# Patient Record
Sex: Male | Born: 1965
Health system: Southern US, Community
[De-identification: ages and names within clinical notes are randomized; demographics above are authoritative.]

## PROBLEM LIST (undated history)

## (undated) DIAGNOSIS — E7800 Pure hypercholesterolemia, unspecified: Secondary | ICD-10-CM

## (undated) DIAGNOSIS — I10 Essential (primary) hypertension: Secondary | ICD-10-CM

## (undated) HISTORY — PX: EYE SURGERY: SHX253

## (undated) HISTORY — PX: APPENDECTOMY: SHX54

---

## 2017-03-03 ENCOUNTER — Encounter (HOSPITAL_BASED_OUTPATIENT_CLINIC_OR_DEPARTMENT_OTHER): Payer: Self-pay | Admitting: Emergency Medicine

## 2017-03-03 ENCOUNTER — Emergency Department (HOSPITAL_BASED_OUTPATIENT_CLINIC_OR_DEPARTMENT_OTHER): Payer: Managed Care, Other (non HMO)

## 2017-03-03 ENCOUNTER — Emergency Department (HOSPITAL_BASED_OUTPATIENT_CLINIC_OR_DEPARTMENT_OTHER)
Admission: EM | Admit: 2017-03-03 | Discharge: 2017-03-03 | Disposition: A | Discharge status: Home / Self Care | Payer: Managed Care, Other (non HMO) | Attending: Emergency Medicine | Admitting: Emergency Medicine

## 2017-03-03 DIAGNOSIS — I10 Essential (primary) hypertension: Secondary | ICD-10-CM | POA: Insufficient documentation

## 2017-03-03 DIAGNOSIS — Y939 Activity, unspecified: Secondary | ICD-10-CM | POA: Insufficient documentation

## 2017-03-03 DIAGNOSIS — S39012A Strain of muscle, fascia and tendon of lower back, initial encounter: Secondary | ICD-10-CM | POA: Insufficient documentation

## 2017-03-03 DIAGNOSIS — Y999 Unspecified external cause status: Secondary | ICD-10-CM | POA: Insufficient documentation

## 2017-03-03 DIAGNOSIS — Y929 Unspecified place or not applicable: Secondary | ICD-10-CM | POA: Diagnosis not present

## 2017-03-03 DIAGNOSIS — R1031 Right lower quadrant pain: Secondary | ICD-10-CM | POA: Insufficient documentation

## 2017-03-03 DIAGNOSIS — M545 Low back pain, unspecified: Secondary | ICD-10-CM

## 2017-03-03 DIAGNOSIS — T148XXA Other injury of unspecified body region, initial encounter: Secondary | ICD-10-CM

## 2017-03-03 DIAGNOSIS — Z79899 Other long term (current) drug therapy: Secondary | ICD-10-CM | POA: Insufficient documentation

## 2017-03-03 DIAGNOSIS — X58XXXA Exposure to other specified factors, initial encounter: Secondary | ICD-10-CM | POA: Diagnosis not present

## 2017-03-03 HISTORY — DX: Essential (primary) hypertension: I10

## 2017-03-03 HISTORY — DX: Pure hypercholesterolemia, unspecified: E78.00

## 2017-03-03 LAB — COMPREHENSIVE METABOLIC PANEL
ALBUMIN: 4 g/dL (ref 3.5–5.0)
ALK PHOS: 42 U/L (ref 38–126)
ALT: 27 U/L (ref 17–63)
ANION GAP: 6 (ref 5–15)
AST: 20 U/L (ref 15–41)
BUN: 18 mg/dL (ref 6–20)
CO2: 26 mmol/L (ref 22–32)
CREATININE: 1.21 mg/dL (ref 0.61–1.24)
Calcium: 9.1 mg/dL (ref 8.9–10.3)
Chloride: 105 mmol/L (ref 101–111)
GFR calc Af Amer: >60 mL/min (ref >60)
GFR calc non Af Amer: >60 mL/min (ref >60)
Glucose, Bld: 103 mg/dL — ABNORMAL HIGH (ref 65–99)
Potassium: 3.9 mmol/L (ref 3.5–5.1)
SODIUM: 137 mmol/L (ref 135–145)
TOTAL PROTEIN: 7.9 g/dL (ref 6.5–8.1)
Total Bilirubin: 0.8 mg/dL (ref 0.3–1.2)

## 2017-03-03 LAB — URINALYSIS, ROUTINE W REFLEX MICROSCOPIC
Bilirubin Urine: NEGATIVE
Glucose, UA: NEGATIVE mg/dL
Hgb urine dipstick: NEGATIVE
Ketones, ur: NEGATIVE mg/dL
LEUKOCYTES UA: NEGATIVE
NITRITE: NEGATIVE
PROTEIN: NEGATIVE mg/dL
SPECIFIC GRAVITY, URINE: 1.025 (ref 1.005–1.030)
pH: 6 (ref 5.0–8.0)

## 2017-03-03 LAB — CBC WITH DIFFERENTIAL/PLATELET
BASOS ABS: 0 10*3/uL (ref 0.0–0.1)
Basophils Relative: 0 %
EOS PCT: 2 %
Eosinophils Absolute: 0.1 10*3/uL (ref 0.0–0.7)
HCT: 43.5 % (ref 39.0–52.0)
HEMOGLOBIN: 15 g/dL (ref 13.0–17.0)
LYMPHS ABS: 1.4 10*3/uL (ref 0.7–4.0)
Lymphocytes Relative: 39 %
MCH: 30.1 pg (ref 26.0–34.0)
MCHC: 34.5 g/dL (ref 30.0–36.0)
MCV: 87.2 fL (ref 78.0–100.0)
Monocytes Absolute: 0.4 10*3/uL (ref 0.1–1.0)
Monocytes Relative: 11 %
NEUTROS PCT: 48 %
Neutro Abs: 1.7 10*3/uL (ref 1.7–7.7)
PLATELETS: 261 10*3/uL (ref 150–400)
RBC: 4.99 MIL/uL (ref 4.22–5.81)
RDW: 13.6 % (ref 11.5–15.5)
WBC: 3.6 10*3/uL — AB (ref 4.0–10.5)

## 2017-03-03 MED ORDER — KETOROLAC TROMETHAMINE 30 MG/ML IJ SOLN
30.0000 mg | Freq: Once | INTRAMUSCULAR | Status: AC
  Administered 2017-03-03: 30 mg via INTRAVENOUS
  Filled 2017-03-03: qty 1

## 2017-03-03 MED ORDER — LISINOPRIL 10 MG PO TABS
10.0000 mg | ORAL_TABLET | Freq: Once | ORAL | Status: AC
  Administered 2017-03-03: 10 mg via ORAL
  Filled 2017-03-03: qty 1

## 2017-03-03 MED ORDER — CYCLOBENZAPRINE HCL 10 MG PO TABS: 10.0000 mg | ORAL_TABLET | Freq: Two times a day (BID) | ORAL | 0 refills | Status: DC | PRN

## 2017-03-03 MED ORDER — MORPHINE SULFATE (PF) 4 MG/ML IV SOLN
4.0000 mg | Freq: Once | INTRAVENOUS | Status: AC
  Administered 2017-03-03: 4 mg via INTRAVENOUS
  Filled 2017-03-03: qty 1

## 2017-03-03 MED ORDER — IOPAMIDOL (ISOVUE-300) INJECTION 61%
100.0000 mL | Freq: Once | INTRAVENOUS | Status: AC | PRN
  Administered 2017-03-03: 100 mL via INTRAVENOUS

## 2017-03-03 MED FILL — CYCLOBENZAPRINE 10 MG TAB: 10 | 10 days supply | Qty: 20 | Fill #0

## 2017-03-03 NOTE — ED Triage Notes (Signed)
Pt reports intense R side back pain after bending over yesterday. Pt states he took a nap and when he woke up pain is radiating to R hip and feels like it may be in his abd as well. Denies urinary symptoms.

## 2017-03-03 NOTE — ED Provider Notes (Signed)
[facility]   CSN: [id number] Arrival date & time: 03/03/17  4098     History   Chief Complaint Chief Complaint  Patient presents with  . Back Pain    HPI Jordan Woods is a 51 y.o. male with PMH/o HTN who presents with 2 days of right sided back pain. Patient reports that pain began while patient was bending down. He reports that since onset, his pain has radiated to the right lower abdomen and right hip. He describes his pain as a "pressure sensation," and reports that it is worse when he ambulates or moves and is better with laying supine. When he ambulates, he describes it as "all the pressure pressing down on my right side."  He reports that he took OTC Gas-X and Advil with no improvement in symptoms. He denies any preceding trauma, fall, injury. He reports his last BM was 2 days ago and was normal. No presence of blood.  Denies fevers, weight loss, numbness/weakness of upper and lower extremities, bowel/bladder incontinence, saddle anesthesia, history of back surgery, history of IVDA. He denies any CP, SOB, nausea/vomiting, dysuria, hematuria, testicular pain/swelling.   The history is provided by the patient.    Past Medical History:  Diagnosis Date  . High cholesterol   . Hypertension     There are no active problems to display for this patient.   Past Surgical History:  Procedure Laterality Date  . APPENDECTOMY    . EYE SURGERY         Home Medications    Prior to Admission medications   Medication Sig Start Date End Date Taking? Authorizing Provider  lisinopril (PRINIVIL,ZESTRIL) 10 MG tablet Take 10 mg by mouth daily.   Yes [provider]  cyclobenzaprine (FLEXERIL) 10 MG tablet Take 1 tablet (10 mg total) by mouth 2 (two) times daily as needed for muscle spasms. 03/03/17   Maxwell Caul, PA-C    Family History No family history on file.  Social History Social History  Substance Use Topics  . Smoking status: Never  Smoker  . Smokeless tobacco: Never Used  . Alcohol use Yes     Allergies   Shellfish allergy   Review of Systems Review of Systems  Constitutional: Negative for chills and fever.  Respiratory: Negative for shortness of breath.   Cardiovascular: Negative for chest pain.  Gastrointestinal: Positive for abdominal pain. Negative for diarrhea, nausea and vomiting.  Genitourinary: Positive for flank pain. Negative for dysuria and hematuria.  Musculoskeletal: Positive for back pain. Negative for neck pain.  Neurological: Negative for dizziness, weakness, numbness and headaches.     Physical Exam Updated Vital Signs BP (!) 169/100 (BP Location: Right Arm)   Pulse 67   Temp 98.7 F (37.1 C) (Oral)   Resp 16   SpO2 100%   Physical Exam  Constitutional: He is oriented to person, place, and time. He appears well-developed and well-nourished.  Appears uncomfortable but no acute distress   HENT:  Head: Normocephalic and atraumatic.  Mouth/Throat: Oropharynx is clear and moist and mucous membranes are normal.  Eyes: Pupils are equal, round, and reactive to light. Conjunctivae, EOM and lids are normal.  Left eye prothesis  Neck: Full passive range of motion without pain.  Full flexion/extension and lateral movement of neck fully intact. No bony midline tenderness. No deformities or crepitus.   Cardiovascular: Normal rate, regular rhythm, normal heart sounds and normal pulses.  Exam reveals no gallop and no friction rub.  No murmur heard. Pulses:      Radial pulses are 2+ on the right side, and 2+ on the left side.       Dorsalis pedis pulses are 2+ on the right side, and 2+ on the left side.  Pulmonary/Chest: Effort normal and breath sounds normal.  Abdominal: Soft. Normal appearance and bowel sounds are normal. He exhibits no distension. There is tenderness in the right lower quadrant and suprapubic area. There is no rigidity, no guarding and no CVA tenderness.  Abdomen is soft,  non-distended. Diffuse tenderness to the right lower abdomen  Musculoskeletal: Normal range of motion.       Thoracic back: He exhibits no tenderness.       Lumbar back: He exhibits no tenderness.  No T or L spine midline tenderness. Diffuse muscular tenderness overlying the right flank that radiates to the right abdomen and in the right hip area. No tenderness to palpation to the right hip joint with no overlying warmth, erythema or ecchymosis. No deformity or crepitus. Pain exacerbated with flexion and internal rotation of hip. BLE are symmetric in appearance.   Neurological: He is alert and oriented to person, place, and time.  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 Capillary refill takes less than 2 seconds.  Psychiatric: He has a normal mood and affect. His speech is normal.  Nursing note and vitals reviewed.    ED Treatments / Results  Labs (all labs ordered are listed, but only abnormal results are displayed) Labs Reviewed  COMPREHENSIVE METABOLIC PANEL - Abnormal; Notable for the following:       Result Value   Glucose, Bld 103 (*)    All other components within normal limits  CBC WITH DIFFERENTIAL/PLATELET - Abnormal; Notable for the following:    WBC 3.6 (*)    All other components within normal limits  URINALYSIS, ROUTINE W REFLEX MICROSCOPIC    EKG  EKG Interpretation None       Radiology Ct Abdomen Pelvis W Contrast  Result Date: 03/03/2017 CLINICAL DATA:  Intense right-sided back pain after bending over yesterday. Pain radiating to the right hip abdomen. EXAM: CT ABDOMEN AND PELVIS WITH CONTRAST TECHNIQUE: Multidetector CT imaging of the abdomen and pelvis was performed using the standard protocol following bolus administration of intravenous contrast.  CONTRAST:  ISOVUE-300 IOPAMIDOL (ISOVUE-300) INJECTION 61% COMPARISON:  None. FINDINGS: Lower chest: 5 mm average diameter subpleural nodule in the right lower lobe. No follow-up recommended per consensus criteria in this patient listed as never smoker. Hepatobiliary: There is a hypervascular rounded mass in the subcapsular left liver measuring 22 mm. Round features are more typical of mass than a perfusion anomaly. This area is not covered on delayed phase which is centered over the kidneys. There is no cirrhotic changes are history of malignancy. No evidence of biliary obstruction or stone. Pancreas: Unremarkable. Spleen: Unremarkable. Adrenals/Urinary Tract: Negative adrenals. No hydronephrosis or stone. Unremarkable bladder. Stomach/Bowel: No obstruction. No inflammatory changes. History of appendectomy. Vascular/Lymphatic: No acute vascular abnormality. No mass or adenopathy. Reproductive:No pathologic findings. Other: Trace pelvic fluid that is nonspecific.  No pneumoperitoneum. Musculoskeletal: No acute abnormalities. Mild spondylosis and facet arthropathy. No visible impingement. IMPRESSION: 1. No specific explanation for right-sided back pain. No acute intra-abdominal finding or evidence of injury. 2.  2.2 cm hypervascular focus in the left liver with rounded shape favoring mass upper perfusion anomaly. Flash fill hemangioma can have this appearance, but features are not specific on this single phase study. At this size, liver MRI is recommended per consensus guidelines (assuming no outside comparison). Electronically Signed   By: Marnee Spring M.D.   On: 03/03/2017 10:47    Procedures Procedures (including critical care time)  Medications Ordered in ED Medications  morphine 4 MG/ML injection 4 mg (4 mg Intravenous Given 03/03/17 0948)  lisinopril (PRINIVIL,ZESTRIL) tablet 10 mg (10 mg Oral Given 03/03/17 0947)  iopamidol (ISOVUE-300) 61 % injection 100 mL (100 mLs Intravenous Contrast Given  03/03/17 1023)  ketorolac (TORADOL) 30 MG/ML injection 30 mg (30 mg Intravenous Given 03/03/17 1139)     Initial Impression / Assessment and Plan / ED Course  I have reviewed the triage vital signs and the nursing notes.  Pertinent labs & imaging results that were available during my care of the patient were reviewed by me and considered in my medical decision making (see chart for details).     51 yo M who presents with 2 days of right back pain That radiated to the right abdomen. Patient is afebrile, non-toxic appearing, sitting comfortably on examination table. Patient is slightly hypertensive, likely secondary to pain. Will reassess. No red flag symptoms or neuro deficits noted on exam. Patient has equal pulses in all 4 extremities. Physical exam shows tenderness to the right flank that radiates to the right abdomen. Patient has diffuse right lower quadrant tenderness. Patient has had an appendectomy previously so no concern for appendicitis. Consider musculoskeletal pain given that patient's pain is worsened with movement and ambulation and improved with rest. Also consider acute infectious etiology given tenderness to the right lower quadrant, the low suspicion given lack of fever, nausea/vomiting. Do not suspect aortic dissection at this time. History/physical exam are not concerning for cauda equina or spinal abscess. Also consider kidney stone, the low suspicion given history/physical exam. We'll plan to check basic labs including CMP, CBC, UA. Will obtain CT abdomen pelvis for further evaluation. Analgesics provided in the department.  Labs reviewed. CMP within normal limits. CBC shows white blood cell count of 3.6 otherwise unremarkable. Urinalysis is negative for any acute signs of infection or any signs of hemoglobin. CT pending. Discussed results with patient. He reports mild improvement of pain after pain medication.   CT abdomen reviewed. There is no evidence of any acute infectious  abnormality. There is mention of a 2.2 cm hypervascular focus in the left liver that may be concerning for hemangioma. Recommends further MRI evaluation on an outpatient basis. Discussed results with patient and wife. Instructed him that he will need an MRI for further evaluation but that this can be done on an outpatient basis. He reports that pain has slightly returned. He reports that he went to ambulate to go the bathroom and had worsening pain. Will plan to give an additional pain medication repeat vitals.  Valuation. Patient reports improvement in pain. Repeat abdominal exam is benign. He still has some mild tenderness but is greatly improved. He still slightly hypertensive, likely secondary to pain. Will plan to treat as musculoskeletal pain. Conservative therapies discussed with patient. Provided patient with a list of clinic resources to use if he does not have a PCP. Instructed to call them today to arrange follow-up in the next 24-48 hours. Strict return precautions discussed. Patient expresses understanding and agreement to plan.  Final Clinical Impressions(s) / ED Diagnoses   Final diagnoses:  Acute right-sided low back pain without sciatica  Muscle strain    New Prescriptions Discharge Medication List as of 03/03/2017 12:31 PM    START taking these medications   Details  cyclobenzaprine (FLEXERIL) 10 MG tablet Take 1 tablet (10 mg total) by mouth 2 (two) times daily as needed for muscle spasms., Starting Thu 03/03/2017, Print         Maxwell Caul, PA-C 03/03/17 1709    Tilden Fossa, MD 03/06/17 [phone]

## 2017-03-03 NOTE — Discharge Instructions (Signed)
Follow-up with your primary care doctor in the next 24-48 hour for further evaluation.  If you do not have a primary care doctor, you can use one provided in the list below.  You can take Tylenol or Ibuprofen as directed for pain. You can alternate Tylenol and Ibuprofen every 4 hours. If you take Tylenol at 1pm, then you can take Ibuprofen at 5pm. Then you can take Tylenol again at 9pm.   Do not take any more ibuprofen until later tonight as the medication we gave you has ibuprofen in it.   Take Flexeril as prescribed. This medication will make you drowsy so do not drive or drink alcohol when taking it.    Return to the Emergency Department immediately for any worsening back pain, neck pain, difficulty walking, numbness/weaknss of your arms or legs, urinary or bowel accidents, fever or any other worsening or concerning symptoms.    Back Pain:   Your back pain should be treated with medicines such as ibuprofen or aleve and this back pain should get better over the next 2 weeks.  However if you develop severe or worsening pain, low back pain with fever, numbness, weakness or inability to walk or urinate, you should return to the ER immediately.    Please follow up with your doctor this week for a recheck if still having symptoms . Low back pain is discomfort in the lower back that may be due to injuries to muscles and ligaments around the spine.  Occasionally, it may be caused by a a problem to a part of the spine called a disc.  The pain may last several days or a week;  However, most patients get completely well in 4 weeks.  Self - care:  The application of heat can help soothe the pain.  Maintaining your daily activities, including walking, is encourged, as it will help you get better faster than just staying in bed.  Medications are also useful to help with pain control.  A commonly prescribed medications includes acetaminophen.  This medication is generally safe, though you should not take  more than 8 of the extra strength ( ) pills a day.  Non steroidal anti inflammatory medications including Ibuprofen and naproxen;  These medications help both pain and swelling and are very useful in treating back pain.  They should be taken with food, as they can cause stomach upset, and more seriously, stomach bleeding.    Muscle relaxants:  These medications can help with muscle tightness that is a cause of lower back pain.  Most of these medications can cause drowsiness, and it is not safe to drive or use dangerous machinery while taking them.

## 2017-03-03 NOTE — ED Notes (Signed)
ED Provider at bedside. 

## 2019-08-18 ENCOUNTER — Ambulatory Visit: Payer: 59 | Attending: Internal Medicine

## 2019-08-18 DIAGNOSIS — Z23 Encounter for immunization: Secondary | ICD-10-CM

## 2019-08-18 NOTE — Progress Notes (Signed)
   Covid-19 Vaccination Clinic  Name:  Jordan Woods    MRN: [id number] DOB: Jan 23, 1966  08/18/2019  [name] was observed post Covid-19 immunization for 15 minutes without incident. He was provided with Vaccine Information Sheet and instruction to access the V-Safe system.   [name] was instructed to call 911 with any severe reactions post vaccine: Marland Kitchen Difficulty breathing  . Swelling of face and throat  . A fast heartbeat  . A bad rash all over body  . Dizziness and weakness   Immunizations Administered    Name Date Dose VIS Date Route   Pfizer COVID-19 Vaccine 08/18/2019  4:29 PM 0.3 mL 05/25/2019 Intramuscular   Manufacturer: [employer], [employer]   Lot: [id number]   NDC: [address]-1

## 2019-09-18 ENCOUNTER — Ambulatory Visit: Payer: 59 | Attending: Internal Medicine

## 2019-09-18 DIAGNOSIS — Z23 Encounter for immunization: Secondary | ICD-10-CM

## 2019-09-18 NOTE — Progress Notes (Signed)
   Covid-19 Vaccination Clinic  Name:  Jordan Woods    MRN: [id number] DOB: [birth year]  09/18/2019  Jordan Woods was observed post Covid-19 immunization for 15 minutes without incident. He was provided with Vaccine Information Sheet and instruction to access the V-Safe system.   Jordan Woods was instructed to call 911 with any severe reactions post vaccine: Marland Kitchen Difficulty breathing  . Swelling of face and throat  . A fast heartbeat  . A bad rash all over body  . Dizziness and weakness   Immunizations Administered    Name Date Dose VIS Date Route   Pfizer COVID-19 Vaccine 09/18/2019  3:19 PM 0.3 mL 05/25/2019 Intramuscular   Manufacturer: [employer], [employer]   Lot: [id number]   NDC: [address]-0

## 2019-11-28 ENCOUNTER — Observation Stay (HOSPITAL_BASED_OUTPATIENT_CLINIC_OR_DEPARTMENT_OTHER)
Admission: EM | Admit: 2019-11-28 | Discharge: 2019-11-29 | Disposition: A | Discharge status: Home / Self Care | Payer: No Typology Code available for payment source | Attending: Family Medicine | Admitting: Family Medicine

## 2019-11-28 ENCOUNTER — Other Ambulatory Visit: Payer: Self-pay

## 2019-11-28 ENCOUNTER — Emergency Department (HOSPITAL_BASED_OUTPATIENT_CLINIC_OR_DEPARTMENT_OTHER): Payer: No Typology Code available for payment source

## 2019-11-28 ENCOUNTER — Emergency Department (HOSPITAL_COMMUNITY): Payer: No Typology Code available for payment source

## 2019-11-28 ENCOUNTER — Encounter (HOSPITAL_BASED_OUTPATIENT_CLINIC_OR_DEPARTMENT_OTHER): Payer: Self-pay | Admitting: Emergency Medicine

## 2019-11-28 DIAGNOSIS — Z79899 Other long term (current) drug therapy: Secondary | ICD-10-CM | POA: Insufficient documentation

## 2019-11-28 DIAGNOSIS — R4701 Aphasia: Secondary | ICD-10-CM | POA: Diagnosis present

## 2019-11-28 DIAGNOSIS — I1310 Hypertensive heart and chronic kidney disease without heart failure, with stage 1 through stage 4 chronic kidney disease, or unspecified chronic kidney disease: Secondary | ICD-10-CM | POA: Insufficient documentation

## 2019-11-28 DIAGNOSIS — Z8249 Family history of ischemic heart disease and other diseases of the circulatory system: Secondary | ICD-10-CM | POA: Insufficient documentation

## 2019-11-28 DIAGNOSIS — I6782 Cerebral ischemia: Secondary | ICD-10-CM | POA: Diagnosis not present

## 2019-11-28 DIAGNOSIS — D72819 Decreased white blood cell count, unspecified: Secondary | ICD-10-CM | POA: Insufficient documentation

## 2019-11-28 DIAGNOSIS — Z91013 Allergy to seafood: Secondary | ICD-10-CM | POA: Insufficient documentation

## 2019-11-28 DIAGNOSIS — N1831 Chronic kidney disease, stage 3a: Secondary | ICD-10-CM | POA: Diagnosis not present

## 2019-11-28 DIAGNOSIS — K219 Gastro-esophageal reflux disease without esophagitis: Secondary | ICD-10-CM | POA: Insufficient documentation

## 2019-11-28 DIAGNOSIS — R29898 Other symptoms and signs involving the musculoskeletal system: Secondary | ICD-10-CM

## 2019-11-28 DIAGNOSIS — Z20822 Contact with and (suspected) exposure to covid-19: Secondary | ICD-10-CM | POA: Diagnosis not present

## 2019-11-28 DIAGNOSIS — Z9001 Acquired absence of eye: Secondary | ICD-10-CM | POA: Insufficient documentation

## 2019-11-28 DIAGNOSIS — R202 Paresthesia of skin: Secondary | ICD-10-CM | POA: Diagnosis present

## 2019-11-28 DIAGNOSIS — E7800 Pure hypercholesterolemia, unspecified: Secondary | ICD-10-CM | POA: Insufficient documentation

## 2019-11-28 DIAGNOSIS — I639 Cerebral infarction, unspecified: Secondary | ICD-10-CM | POA: Diagnosis not present

## 2019-11-28 DIAGNOSIS — E785 Hyperlipidemia, unspecified: Secondary | ICD-10-CM | POA: Insufficient documentation

## 2019-11-28 DIAGNOSIS — I63312 Cerebral infarction due to thrombosis of left middle cerebral artery: Secondary | ICD-10-CM

## 2019-11-28 DIAGNOSIS — I10 Essential (primary) hypertension: Secondary | ICD-10-CM | POA: Diagnosis not present

## 2019-11-28 LAB — CBC
HCT: 42.6 % (ref 39.0–52.0)
Hemoglobin: 14.2 g/dL (ref 13.0–17.0)
MCH: 30.9 pg (ref 26.0–34.0)
MCHC: 33.3 g/dL (ref 30.0–36.0)
MCV: 92.8 fL (ref 80.0–100.0)
Platelets: 290 10*3/uL (ref 150–400)
RBC: 4.59 MIL/uL (ref 4.22–5.81)
RDW: 13.5 % (ref 11.5–15.5)
WBC: 3.9 10*3/uL — ABNORMAL LOW (ref 4.0–10.5)
nRBC: 0 % (ref 0.0–0.2)

## 2019-11-28 LAB — COMPREHENSIVE METABOLIC PANEL
ALT: 24 U/L (ref 0–44)
AST: 21 U/L (ref 15–41)
Albumin: 4.1 g/dL (ref 3.5–5.0)
Alkaline Phosphatase: 44 U/L (ref 38–126)
Anion gap: 9 (ref 5–15)
BUN: 18 mg/dL (ref 6–20)
CO2: 28 mmol/L (ref 22–32)
Calcium: 9.1 mg/dL (ref 8.9–10.3)
Chloride: 101 mmol/L (ref 98–111)
Creatinine, Ser: 1.47 mg/dL — ABNORMAL HIGH (ref 0.61–1.24)
GFR calc Af Amer: >60 mL/min (ref >60)
GFR calc non Af Amer: 54 mL/min — ABNORMAL LOW (ref >60)
Glucose, Bld: 92 mg/dL (ref 70–99)
Potassium: 3.9 mmol/L (ref 3.5–5.1)
Sodium: 138 mmol/L (ref 135–145)
Total Bilirubin: 0.5 mg/dL (ref 0.3–1.2)
Total Protein: 7.9 g/dL (ref 6.5–8.1)

## 2019-11-28 LAB — DIFFERENTIAL
Abs Immature Granulocytes: 0.01 10*3/uL (ref 0.00–0.07)
Basophils Absolute: 0 10*3/uL (ref 0.0–0.1)
Basophils Relative: 1 %
Eosinophils Absolute: 0.1 10*3/uL (ref 0.0–0.5)
Eosinophils Relative: 3 %
Immature Granulocytes: 0 %
Lymphocytes Relative: 36 %
Lymphs Abs: 1.4 10*3/uL (ref 0.7–4.0)
Monocytes Absolute: 0.4 10*3/uL (ref 0.1–1.0)
Monocytes Relative: 11 %
Neutro Abs: 1.9 10*3/uL (ref 1.7–7.7)
Neutrophils Relative %: 49 %

## 2019-11-28 LAB — PROTIME-INR
INR: 1 (ref 0.8–1.2)
Prothrombin Time: 12.4 seconds (ref 11.4–15.2)

## 2019-11-28 LAB — SARS CORONAVIRUS 2 BY RT PCR (HOSPITAL ORDER, PERFORMED IN ~~LOC~~ HOSPITAL LAB): SARS Coronavirus 2: NEGATIVE

## 2019-11-28 LAB — APTT: aPTT: 28 seconds (ref 24–36)

## 2019-11-28 MED ORDER — ATORVASTATIN CALCIUM 10 MG PO TABS
20.0000 mg | ORAL_TABLET | Freq: Every day | ORAL | Status: DC
  Administered 2019-11-28: 20 mg via ORAL
  Filled 2019-11-28: qty 2

## 2019-11-28 MED ORDER — HYDRALAZINE HCL 20 MG/ML IJ SOLN: 5.0000 mg | INTRAMUSCULAR | Status: DC | PRN

## 2019-11-28 MED ORDER — SODIUM CHLORIDE 0.9% FLUSH
3.0000 mL | Freq: Once | INTRAVENOUS | Status: DC
  Filled 2019-11-28: qty 3

## 2019-11-28 MED ORDER — PANTOPRAZOLE SODIUM 40 MG PO TBEC
40.0000 mg | DELAYED_RELEASE_TABLET | Freq: Every day | ORAL | Status: DC
  Administered 2019-11-28 – 2019-11-29 (×2): 40 mg via ORAL
  Filled 2019-11-28 (×2): qty 1

## 2019-11-28 MED ORDER — IOHEXOL 350 MG/ML SOLN
100.0000 mL | Freq: Once | INTRAVENOUS | Status: AC | PRN
  Administered 2019-11-28: 80 mL via INTRAVENOUS

## 2019-11-28 MED ORDER — ATORVASTATIN CALCIUM 40 MG PO TABS
40.0000 mg | ORAL_TABLET | Freq: Every day | ORAL | Status: DC
  Administered 2019-11-28: 20 mg via ORAL
  Administered 2019-11-29: 40 mg via ORAL
  Filled 2019-11-28 (×2): qty 1

## 2019-11-28 MED ORDER — AMLODIPINE BESYLATE 5 MG PO TABS
5.0000 mg | ORAL_TABLET | Freq: Every day | ORAL | Status: DC
  Administered 2019-11-28: 5 mg via ORAL
  Filled 2019-11-28: qty 1

## 2019-11-28 MED ORDER — ASPIRIN 81 MG PO CHEW
81.0000 mg | CHEWABLE_TABLET | Freq: Every day | ORAL | Status: DC
  Administered 2019-11-29: 81 mg via ORAL
  Filled 2019-11-28: qty 1

## 2019-11-28 MED ORDER — ENOXAPARIN SODIUM 40 MG/0.4ML ~~LOC~~ SOLN
40.0000 mg | SUBCUTANEOUS | Status: DC
  Administered 2019-11-28: 40 mg via SUBCUTANEOUS
  Filled 2019-11-28: qty 0.4

## 2019-11-28 MED ORDER — HYDROCHLOROTHIAZIDE 12.5 MG PO CAPS: 12.5000 mg | ORAL_CAPSULE | Freq: Every day | ORAL | Status: DC

## 2019-11-28 MED ORDER — LISINOPRIL 20 MG PO TABS: 20.0000 mg | ORAL_TABLET | Freq: Every day | ORAL | Status: DC

## 2019-11-28 NOTE — ED Notes (Signed)
Dinner ordered 

## 2019-11-28 NOTE — Progress Notes (Addendum)
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  Given this, paged neuro to discuss recommendations regarding antiplatelet therapy and DVT PPx.  Spoke with Dr. Otelia Limes.  He states that since patient stroke is chronic, he can be on DVT prophylaxis and aspirin.  Patient does not need to be on Plavix.  Believe that stroke is small vessel caused by chronic hypertension.  States that patient should not have permissive hypertension at this time, goal should be 120/80.  He should be on aspirin 81 mg.  He should also start a light daily walking program and focus of his treatment should be blood pressure control.  Update: Per PCP note 09/14/2019, patient on norvasc 5mg  and lisinopril-HCTZ 20mg -12.5mg  2 tabs daily.  Will start norvasc now at 5mg .  Given that patient had contrast today with CTA, will hold off on starting Lisinopril and HCTZ.  Will see patient's response to norvasc and add hydral if patient is not at goal overnight.   Jordan Woods, , DO 11/28/2019, 8:02 PM PGY-2, [facility] pager [phone]

## 2019-11-28 NOTE — ED Provider Notes (Signed)
4:10 PM-patient has arrived from [facility] for further evaluation/MRI imaging.  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  He describes having difficulty storing his coffee, 3 days ago.  He has preserved strength, "I was able to lift weights this morning," but a sensation of "numbness" in his right forearm and hand.  He is eating well.  He denies chest pain, shortness of breath, headache, blurred vision, focal weakness or dizziness.  Plan-MRI imaging is pending-4:15 PM  5:50 PM-MRI consistent with acute left brain stroke.  This is clinically synergistic with exam.  Will arrange for admission.  At this time the patient is calm comfortable.  Findings discussed with him and all questions were answered.  His PCP is with [facility].  5:56 PM-Consult complete with on-call family medicine resident. Patient case explained and discussed.  He agrees to admit patient for further evaluation and treatment. Call ended at 6:08 PM  6:01 PM-case discussed with neuro hospitalist, who will see the patient as a Research scientist (medical) in the hospital.   Mancel Bale, MD 11/28/19 1814

## 2019-11-28 NOTE — ED Triage Notes (Signed)
PT here with aphasia, limited spatial awareness and right arm numbness since Saturday. 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

## 2019-11-28 NOTE — ED Notes (Signed)
Attempted report x 1, 5W RN to call for report when available.

## 2019-11-28 NOTE — Consult Note (Signed)
Referring Physician: Dr. Manson Passey    Chief Complaint: RUE weakness, incoordination and numbness  HPI: Jordan Woods is a 54 y.o. male with a history of hypercholesterolemia and HTN who presented to the ED this AM with RUE weakness, incoordination and numbness since Saturday AM, first noticed on awakening from sleep. Examples of the deficits he noticed include difficulty stirring his coffee due to incoordination, difficulty getting some words out correctly when speaking, slurred speech, dropping cups of coffee and trouble writing.   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 Also noted was a chronic right thalamic hemorrhage.  LSN: Satrurday tPA Given: No: Out of the time window  Past Medical History:  Diagnosis Date   High cholesterol    Hypertension     Past Surgical History:  Procedure Laterality Date   APPENDECTOMY     EYE SURGERY      History reviewed. No pertinent family history. Social History:  reports that he has never smoked. He has never used smokeless tobacco. He reports current alcohol use. He reports that he does not use drugs.  Allergies:  Allergies  Allergen Reactions   Shellfish Allergy     Home Medications:  No current facility-administered medications on file prior to encounter.   Current Outpatient Medications on File Prior to Encounter  Medication Sig Dispense Refill   amLODipine (NORVASC) 5 MG tablet Take by mouth.     atorvastatin (LIPITOR) 20 MG tablet Take 1 tablet by mouth daily.     pantoprazole (PROTONIX) 40 MG tablet Take by mouth.     cyclobenzaprine (FLEXERIL) 10 MG tablet Take 1 tablet (10 mg total) by mouth 2 (two) times daily as needed for muscle spasms. 20 tablet 0   lisinopril (PRINIVIL,ZESTRIL) 10 MG tablet Take 10 mg by mouth  daily.       ROS: No vision changes. No lower extremity weakness or numbness. Other symptoms as per HPI with comprehensive ROS otherwise negative.   Physical Examination: Blood pressure (!) 148/104, pulse 77, temperature 97.8 F (36.6 C), resp. rate 15, height 6' (1.829 m), weight 112.5 kg, SpO2 99 %.  HEENT: [place]/AT. Left eye enucleation (chronic) Lungs: Respirations unlabored Ext: No edema  Neurologic Examination: Mental Status: Alert, oriented, thought content appropriate.  Speech fluent without evidence of aphasia.  Able to follow all commands without difficulty. No dysarthria noted.  Cranial Nerves: II:  Visual fields intact OD. Enucleated left eye. Fixates and tracks normally with right eye.  III,IV, VI: Left eyelid shut when prosthetic globe has been removed. Right eye without ptosis. Right eye EOMI. No nystagmus.  V,VII: Mild RLQ facial weakness. Temp sensation equal bilaterally.  VIII: Hearing intact to conversation IX,X: No hypophonia or hoarseness XI: Slight lag on the right with shoulder shrug XII: Tongue protrudes slightly to the right of midline  Motor: RUE with 4+/5 grip, biceps, triceps and deltoid LUE 5/5 BLE 5/5 except for 4+/5 right plantar flexion Mild pronator drift on the right.  Sensory: Temp and light touch intact in all 4 extremities. No extinction to DSS Deep Tendon Reflexes:  Normoactive x 4.  Plantars: Right: downgoing   Left: downgoing Cerebellar: Slow FNF on the right without dysmetria or dyssinergia. Normal FNF on the left.  Gait: Deferred  Results for orders placed or  performed during the hospital encounter of 11/28/19 (from the past 48 hour(s))  Protime-INR     Status: None   Collection Time: 11/28/19 11:21 AM  Result Value Ref Range   Prothrombin Time 12.4 11.4 - 15.2 seconds   INR 1.0 0.8 - 1.2    Comment: (NOTE) INR goal varies based on device and disease states. Performed at [facility], [address] [place], [place]  [address]   APTT     Status: None   Collection Time: 11/28/19 11:21 AM  Result Value Ref Range   aPTT 28 24 - 36 seconds    Comment: Performed at [facility], [address] [place], [place] [address]  CBC     Status: Abnormal   Collection Time: 11/28/19 11:21 AM  Result Value Ref Range   WBC 3.9 (L) 4.0 - 10.5 K/uL   RBC 4.59 4.22 - 5.81 MIL/uL   Hemoglobin 14.2 13.0 - 17.0 g/dL   HCT 93.2 39 - 52 %   MCV 92.8 80.0 - 100.0 fL   MCH 30.9 26.0 - 34.0 pg   MCHC 33.3 30.0 - 36.0 g/dL   RDW 67.1 24.5 - 80.9 %   Platelets 290 150 - 400 K/uL   nRBC 0.0 0.0 - 0.2 %    Comment: Performed at [facility], [address] [place], [place] [address]  Differential     Status: None   Collection Time: 11/28/19 11:21 AM  Result Value Ref Range   Neutrophils Relative % 49 %   Neutro Abs 1.9 1.7 - 7.7 K/uL   Lymphocytes Relative 36 %   Lymphs Abs 1.4 0.7 - 4.0 K/uL   Monocytes Relative 11 %   Monocytes Absolute 0.4 0 - 1 K/uL   Eosinophils Relative 3 %   Eosinophils Absolute 0.1 0 - 0 K/uL   Basophils Relative 1 %   Basophils Absolute 0.0 0 - 0 K/uL   Immature Granulocytes 0 %   Abs Immature Granulocytes 0.01 0.00 - 0.07 K/uL    Comment: Performed at [facility], [address] [place], [place] [address]  Comprehensive metabolic panel     Status: Abnormal   Collection Time: 11/28/19 11:21 AM  Result Value Ref Range   Sodium 138 135 - 145 mmol/L   Potassium 3.9 3.5 - 5.1 mmol/L   Chloride 101 98 - 111 mmol/L   CO2 28 22 - 32 mmol/L   Glucose, Bld 92 70 - 99 mg/dL    Comment: Glucose reference range applies only to samples taken after fasting for at least 8 hours.   BUN 18 6 - 20 mg/dL   Creatinine, Ser 9.76 (H) 0.61 - 1.24 mg/dL   Calcium 9.1 8.9 - 73.4 mg/dL   Total Protein 7.9 6.5 - 8.1 g/dL   Albumin 4.1 3.5 - 5.0 g/dL   AST 21 15 - 41 U/L   ALT 24 0 - 44 U/L   Alkaline Phosphatase 44 38 - 126 U/L   Total Bilirubin 0.5 0.3 - 1.2 mg/dL   GFR  calc non Af Amer 54 (L) >60 mL/min   GFR calc Af Amer >60 >60 mL/min   Anion gap 9 5 - 15    Comment: Performed at [facility], [address] [place], [place] [address]  SARS Coronavirus 2 by RT PCR (hospital order, performed in [facility] hospital lab) Nasopharyngeal Nasopharyngeal Swab  Status: None   Collection Time: 11/28/19 12:07 PM   Specimen: Nasopharyngeal Swab  Result Value Ref Range   SARS Coronavirus 2 NEGATIVE NEGATIVE    Comment: (NOTE) SARS-CoV-2 target nucleic acids are NOT DETECTED.  The SARS-CoV-2 RNA is generally detectable in upper and lower respiratory specimens during the acute phase of infection. The lowest concentration of SARS-CoV-2 viral copies this assay can detect is 250 copies / mL. A negative result does not preclude SARS-CoV-2 infection and should not be used as the sole basis for treatment or other patient management decisions.  A negative result may occur with improper specimen collection / handling, submission of specimen other than nasopharyngeal swab, presence of viral mutation(s) within the areas targeted by this assay, and inadequate number of viral copies (<250 copies / mL). A negative result must be combined with clinical observations, patient history, and epidemiological information.  Fact Sheet for Patients:   StrictlyIdeas.no  Fact Sheet for Healthcare Providers: BankingDealers.co.za  This test is not yet approved or  cleared by the [place] FDA and has been authorized for detection and/or diagnosis of SARS-CoV-2 by FDA under an Emergency Use Authorization (EUA).  This EUA will remain in effect (meaning this test can be used) for the duration of the COVID-19 declaration under Section 564(b)(1) of the Act, 21 U.S.C. section 360bbb-3(b)(1), unless the authorization is terminated or revoked sooner.  Performed at [facility], [address], [place],  [place] [address]    CT Angio Head W/Cm &/Or Wo Cm  Result Date: 11/28/2019 CLINICAL DATA:  Aphasia. Right arm numbness. Limited spatial awareness. EXAM: CT ANGIOGRAPHY HEAD TECHNIQUE: Multidetector CT imaging of the head was performed using the standard protocol during bolus administration of intravenous contrast. Multiplanar CT image reconstructions and MIPs were obtained to evaluate the vascular anatomy. CONTRAST:  5mL OMNIPAQUE IOHEXOL 350 MG/ML SOLN COMPARISON:  None. FINDINGS: CT HEAD Brain: Ventricle size normal. Cerebral volume slightly decreased for age. Moderate to extensive white matter disease bilaterally. This is symmetric and appears most consistent with chronic microvascular ischemia. Negative for acute infarct, hemorrhage, mass. Vascular: Negative for hyperdense vessel Skull: Negative Sinuses: Mild mucosal edema left maxillary sinus. Remaining sinuses clear. 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 Right orbit normal. CTA HEAD Anterior circulation: Cavernous carotid widely patent without atherosclerotic disease or stenosis. No aneurysm. Anterior and middle cerebral arteries widely patent bilaterally without stenosis. Anterior and middle cerebral arteries widely patent bilaterally without stenosis or large vessel occlusion Posterior circulation: Left vertebral artery dominant and widely patent. Left PICA patent. Small right vertebral artery is patent. Right PICA patent. Basilar widely patent. AICA, superior cerebellar, posterior cerebral arteries patent bilaterally without stenosis or aneurysm. Venous sinuses: Normal venous enhancement. Anatomic variants: None IMPRESSION: 1. Extensive small vessel ischemic change in the white matter which is most likely chronic. Given the patient's symptoms, recommend MRI to evaluate for acute infarct. Correlate with risk factors for small vessel disease. No intracranial hemorrhage. 2. Negative CTA head 3. These results were called by  telephone at the time of interpretation on 11/28/2019 at 1:10 pm to provider [facility] , who verbally acknowledged these results. Electronically Signed   By: Franchot Gallo M.D.   On: 11/28/2019 13:11   MR Brain Wo Contrast (neuro protocol)  Result Date: 11/28/2019 CLINICAL DATA:  Right upper extremity weakness and incoordination. Speech disturbance. EXAM: MRI HEAD WITHOUT CONTRAST TECHNIQUE: Multiplanar, multiecho pulse sequences of the brain and surrounding structures were obtained without  intravenous contrast. 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 Patchy and confluent T2 hyperintensities in the cerebral white matter bilaterally are nonspecific but compatible with moderate to severe chronic small vessel ischemic disease, greatly advanced for age. There is a chronic hemorrhage superiorly in the right thalamus. There is mild cerebral atrophy. Vascular: Abnormal appearance of the distal right vertebral artery, shown to be hypoplastic but patent on the earlier CTA. Skull and upper cervical spine: Unremarkable bone marrow signal. Sinuses/Orbits: Left ocular prosthesis. Mild scattered mucosal thickening and small mucous retention cysts in the paranasal sinuses. Clear mastoid air cells. Other: None. IMPRESSION: 1. 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 2. Moderate to severe chronic small vessel ischemic disease. 3. Chronic right thalamic hemorrhage. Electronically Signed   By: Sebastian Ache M.D.   On: 11/28/2019 17:45    Assessment: 54 y.o. male presenting with RUE weakness and incoordination. Symptoms began on Saturday.  1. Exam reveals mild right sided motor findings.  2. 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 Also noted was a chronic right thalamic hemorrhage. 3.  Stroke Risk Factors - HTN and hypercholesterolemia  Recommendations: 1. HgbA1c, fasting lipid panel 2. MRA of the brain without contrast 3. PT consult, OT consult, Speech consult 4. TTE 5. Carotid dopplers 6. Prophylactic therapy- Start ASA 81 mg po qd. Benefits of antiplatelet therapy for secondary stroke prevention outweigh risks of hemorrhage in the context of the known chronic right thalamic hemorrhage, provided that his BP is consistently well-controlled 7. Risk factor modification to include a daily light exercise regimen such as walking.  8. Telemetry monitoring 9. Frequent neuro checks 10. BP management with goal of 120/80. Out of permissive HTN time window. 11. Increase atorvastatin to 40 mg po qd. Obtain baseline CK level    @Electronically  signed: Dr.  11/28/2019, 7:15 PM

## 2019-11-28 NOTE — H&P (Signed)
[facility] Admission History and Physical Service Pager: [phone]  Patient name: Jordan Woods Medical record number: [id number] Date of birth: September 21, 1965 Age: 54 y.o. Gender: male  Primary Care Provider: Patient, No Pcp Per Consultants: Neurology Code Status: Full  Chief Complaint: Arm weakness and speech change  Assessment and Plan: Jordan Woods is a 54 y.o. male presenting with acute stroke. PMH is significant for hypertension, hyperlipidemia, leukopenia  Stroke Patient is a 54 year old male that presents today after 4 days of arm weakness and numbness and slurring of speech.  CTA head was significant for extensive small vessel ischemic change in the white matter, most likely chronic.  MRI performed in the emergency department significant for acute infarct of the posterior limb of the left internal capsule, moderate to severe chronic small vessel ischemic disease and a right chronic thalamic hemorrhage.  Neurology was consulted in the emergency department due to the acute stroke.  He has a known history of hypertension and a family history significant for stroke in his father, MI in his brother.  He denies any history of palpitations or known arrhythmias. -Admit to progressive, attending Dr. Owens Shark -Neurology consult, appreciate recommendations -Holding aspirin and Plavix for now while awaiting neurology recommendations -SLP, he passed bedside swallow but SLP would still be helpful for further evaluation of his dysarthria -Follow-up echocardiogram -Follow-up neurology recommendations regarding neck imaging -Continuous pulse ox -Continuous cardiac monitoring -PT/OT evaluate and treat -Frequent neurochecks -Vitals per floor -Follow-up lipid panel and A1c  Hypertension Blood pressures in the emergency department ranged from 138-148/87-104.  Home medications include lisinopril-hydrochlorothiazide 20-12.5 daily as well as amlodipine 5 mg/day. -Hold home  medications for now -Permissive hypertension -Hydralazine for systolic pressures over 967, diastolic pressures over 893  Hyperlipidemia Home medications include atorvastatin 20 mg -We will plan to increase to at least atorvastatin 40 mg -Follow-up lipid panel  Leukopenia-stable Per care everywhere notes it looks like the plan at 1 time was to refer him for hematology.  Per chart review it appears patient had a consult with hematology on 11/11/2017 and per review it looks like his white count around that time was 3200 with an absolute neutrophil count of 1500.  Per chart review of years the plan was to recheck the CBC in 1 year as this was a likely benign finding.  CBC during this admission significant for white blood cell count of 3.9 with absolute neutrophil count of 1,900. -No further work-up at this time  Question of OSA Per recent office visit notes it appears the patient has had an increase in fatigue in 2019 with patient's wife stating that he snores a lot.  At that time the patient was focusing on trying to lose at least 10% of his body weight.  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  He is currently wearing an eye patch while he does not have a prosthetic eye in place.  FEN/GI: General diet Prophylaxis: Lovenox  Disposition: 1-2 days of hospitalization anticipated prior to discharge  History of Present Illness:  Jordan Woods is a 54 y.o. male presenting with new CVA.  He has a previous medical history significant for hypertension.  Jordan Woods notes that he first felt his symptoms Saturday morning (4 days ago) upon waking up.  At that time, he noticed some right arm numbness below the  elbow and difficulty using his right hand.  In the past 4 days, he is noticed significant difficulty writing with his right hand-his dominant  hand-and the has to hold the pencil with both hands in order to form legible letters.  He is also noticed some difficulty talking.  He has to focus incredibly hard to form his words and avoid slurring his speech.  His wife is also noticed his occasional garbled speech in the past 3-4 days.  After 3 days of persistent symptoms, he tried to schedule an appointment with his PCP with [facility] in [facility] but she was unavailable.  He then went to urgent care where he was directed immediately to the [facility] ED.  In the ED, a CTA head was performed which showed no evidence of intracranial hemorrhage and no obvious lesion.  This is followed up with an MR brain due to high suspicion for stroke which revealed a stroke to the left internal capsule.  Neuro was consulted in the ED and is planning to consult on the patient although has not yet seen her left a note for [name].  Review Of Systems: Per HPI with the following additions:   Review of Systems  Constitutional: Negative for appetite change, chills, fatigue and fever.  HENT: Negative for sore throat and trouble swallowing.   Eyes: Negative for photophobia and visual disturbance.  Respiratory: Negative for cough, chest tightness and shortness of breath.   Cardiovascular: Negative for chest pain and palpitations.  Gastrointestinal: Negative for abdominal pain, diarrhea, nausea and vomiting.  Genitourinary: Negative for difficulty urinating.  Musculoskeletal: Positive for gait problem.  Neurological: Positive for speech difficulty, weakness and numbness (right forearm/hand). Negative for dizziness, facial asymmetry and headaches.     Patient Active Problem List   Diagnosis Date Noted  . Stroke [facility]) 11/28/2019    Past Medical History: Past Medical History:  Diagnosis Date  . High cholesterol   . Hypertension     Past Surgical History: Past Surgical History:  Procedure Laterality Date  . APPENDECTOMY    . EYE SURGERY       Social History: Social History   Tobacco Use  . Smoking status: Never Smoker  . Smokeless tobacco: Never Used  Substance Use Topics  . Alcohol use: Yes  . Drug use: No   Additional social history: Lives at home with his wife and frequently travels to [place] to work on their [place]. Please also refer to relevant sections of EMR.  Family History: History reviewed. No pertinent family history.  Allergies and Medications: Allergies  Allergen Reactions  . Shellfish Allergy    No current facility-administered medications on file prior to encounter.   Current Outpatient Medications on File Prior to Encounter  Medication Sig Dispense Refill  . amLODipine (NORVASC) 5 MG tablet Take by mouth.    Marland Kitchen atorvastatin (LIPITOR) 20 MG tablet Take 1 tablet by mouth daily.    . pantoprazole (PROTONIX) 40 MG tablet Take by mouth.    . cyclobenzaprine (FLEXERIL) 10 MG tablet Take 1 tablet (10 mg total) by mouth 2 (two) times daily as needed for muscle spasms. 20 tablet 0  . lisinopril (PRINIVIL,ZESTRIL) 10 MG tablet Take 10 mg by mouth daily.      Objective: BP (!) 148/104   Pulse 77   Temp 97.8 F (36.6 C)   Resp 15   Ht 6' (1.829 m)   Wt 112.5 kg   SpO2 99%   BMI 33.63 kg/m  Exam: Physical Exam Constitutional:      General: He is not in acute distress.    Appearance: Normal appearance. He is not ill-appearing.     Comments: Patch worn over his left eye.  HENT:     Right Ear: External ear normal.     Left Ear: External ear normal.     Mouth/Throat:     Mouth: Mucous membranes are moist.     Pharynx: Oropharynx is clear.  Eyes:     Extraocular Movements: Extraocular movements intact.     Conjunctiva/sclera: Conjunctivae normal.     Pupils: Pupils are equal, round, and reactive to light.  Cardiovascular:     Rate and Rhythm: Normal rate and regular rhythm.     Pulses: Normal pulses.     Heart sounds: Normal heart sounds. No murmur heard.  No gallop.    Pulmonary:     Effort: Pulmonary effort is normal.     Breath sounds: Normal breath sounds.  Abdominal:     General: Abdomen is flat.     Palpations: Abdomen is soft.  Musculoskeletal:        General: No swelling or tenderness.     Cervical back: Normal range of motion and neck supple.  Skin:    General: Skin is warm and dry.  Neurological:     Mental Status: He is alert.     Comments: Cranial nerves II through XII intact.  Mild garbled speech appreciated during our conversation no gross deficits on cranial nerve exam. Upper extremities: 5/5 strength for elbow flexion and extension although Mr. Janey Greaser reports he may have some decreased strength in his right arm.  5/5 grip strength bilaterally.  Sensation to light touch diminished on the right side.  2+ biceps and brachioradialis reflex. Lower extremities: 5/5 strength for hip flexion, plantarflexion, dorsiflexion bilaterally.  Psychiatric:        Mood and Affect: Mood normal.        Thought Content: Thought content normal.       Labs and Imaging: CBC BMET  Recent Labs  Lab 11/28/19 1121  WBC 3.9*  HGB 14.2  HCT 42.6  PLT 290   Recent Labs  Lab 11/28/19 1121  NA 138  K 3.9  CL 101  CO2 28  BUN 18  CREATININE 1.47*  GLUCOSE 92  CALCIUM 9.1     CT Angio Head W/Cm &/Or Wo Cm  Result Date: 11/28/2019 CLINICAL DATA:  Aphasia. Right arm numbness. Limited spatial awareness. EXAM: CT ANGIOGRAPHY HEAD TECHNIQUE: Multidetector CT imaging of the head was performed using the standard protocol during bolus administration of intravenous contrast. Multiplanar CT image reconstructions and MIPs were obtained to evaluate the vascular anatomy. CONTRAST:  26mL OMNIPAQUE IOHEXOL 350 MG/ML SOLN COMPARISON:  None. FINDINGS: CT HEAD Brain: Ventricle size normal. Cerebral volume slightly decreased for age. Moderate to extensive white matter disease bilaterally. This is symmetric and appears most consistent with chronic microvascular  ischemia. Negative for acute infarct, hemorrhage, mass. Vascular: Negative for hyperdense vessel Skull: Negative Sinuses: Mild mucosal edema left maxillary sinus. Remaining sinuses clear. 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 Right orbit normal. CTA HEAD Anterior circulation: Cavernous carotid widely patent without atherosclerotic disease or stenosis. No aneurysm. Anterior and middle cerebral arteries widely patent bilaterally without stenosis. Anterior and middle cerebral arteries widely patent bilaterally without stenosis or large vessel occlusion Posterior circulation: Left vertebral artery dominant and widely patent. Left PICA patent. Small right vertebral artery is patent.  Right PICA patent. Basilar widely patent. AICA, superior cerebellar, posterior cerebral arteries patent bilaterally without stenosis or aneurysm. Venous sinuses: Normal venous enhancement. Anatomic variants: None IMPRESSION: 1. Extensive small vessel ischemic change in the white matter which is most likely chronic. Given the patient's symptoms, recommend MRI to evaluate for acute infarct. Correlate with risk factors for small vessel disease. No intracranial hemorrhage. 2. Negative CTA head 3. These results were called by telephone at the time of interpretation on 11/28/2019 at 1:10 pm to provider [facility] , who verbally acknowledged these results. Electronically Signed   By: Marlan Palau M.D.   On: 11/28/2019 13:11   MR Brain Wo Contrast (neuro protocol)  Result Date: 11/28/2019 CLINICAL DATA:  Right upper extremity weakness and incoordination. Speech disturbance. EXAM: MRI HEAD WITHOUT CONTRAST TECHNIQUE: Multiplanar, multiecho pulse sequences of the brain and surrounding structures were obtained without intravenous contrast. 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 Patchy and confluent T2  hyperintensities in the cerebral white matter bilaterally are nonspecific but compatible with moderate to severe chronic small vessel ischemic disease, greatly advanced for age. There is a chronic hemorrhage superiorly in the right thalamus. There is mild cerebral atrophy. Vascular: Abnormal appearance of the distal right vertebral artery, shown to be hypoplastic but patent on the earlier CTA. Skull and upper cervical spine: Unremarkable bone marrow signal. Sinuses/Orbits: Left ocular prosthesis. Mild scattered mucosal thickening and small mucous retention cysts in the paranasal sinuses. Clear mastoid air cells. Other: None. IMPRESSION: 1. 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 2. Moderate to severe chronic small vessel ischemic disease. 3. Chronic right thalamic hemorrhage. Electronically Signed   By: Sebastian Ache M.D.   On: 11/28/2019 17:45     Mirian Mo, MD 11/28/2019, 6:56 PM  PGY-2, [facility] FPTS Intern pager: [phone], text pages welcome

## 2019-11-28 NOTE — ED Provider Notes (Signed)
[facility] EMERGENCY DEPARTMENT Provider Note   CSN: [id number] Arrival date & time: 11/28/19  1050     History Chief Complaint  Patient presents with  . Aphasia  . right arm numbess    Jordan Woods is a 54 y.o. male.  HPI Patient was in [place] this weekend helping his wife do renovations on a home they own.  All day Friday he did extensive amounts of renovation work without difficulty.  He went to bed Friday night about 1 AM.  He reports he woke up around 10 in the morning and went in to make coffee.  He was trying to stir his coffee and noticed that his right hand was very incoordinated and had difficulty making the normal circular motion with a coffee.  He also noticed that he had to be exceptionally careful in speaking so that the words would come out correctly.  He reports his speech was a little slurred.  He reports he was hoping it would get better and did not seek any treatment at that time.  He did not have any lower extremity symptoms.  He reports his strength in the arm seemed to be intact he was still able to work out.  He reports however the coordination was bad.  He has not been able to write his name.  He reports today he dropped 2 cups of coffee and determined as it is not going away he needed to come and get evaluated.  No headache or visual changes.  Patient reports he does wear an eye patch on the left sometimes due to old traumatic eye injury.  Patient has a prosthesis he wears typically.  Patient already has a history of hypertension which has been controlled on medications.    Past Medical History:  Diagnosis Date  . High cholesterol   . Hypertension     There are no problems to display for this patient.   Past Surgical History:  Procedure Laterality Date  . APPENDECTOMY    . EYE SURGERY         History reviewed. No pertinent family history.  Social History   Tobacco Use  . Smoking status: Never Smoker  . Smokeless tobacco: Never Used    Substance Use Topics  . Alcohol use: Yes  . Drug use: No    Home Medications Prior to Admission medications   Medication Sig Start Date End Date Taking? Authorizing Provider  amLODipine (NORVASC) 5 MG tablet Take by mouth. 09/14/19  Yes [provider]  atorvastatin (LIPITOR) 20 MG tablet Take 1 tablet by mouth daily. 09/14/19  Yes [provider]  pantoprazole (PROTONIX) 40 MG tablet Take by mouth. 09/14/19  Yes [provider]  cyclobenzaprine (FLEXERIL) 10 MG tablet Take 1 tablet (10 mg total) by mouth 2 (two) times daily as needed for muscle spasms. 03/03/17   Maxwell Caul, PA-C  lisinopril (PRINIVIL,ZESTRIL) 10 MG tablet Take 10 mg by mouth daily.    [provider]    Allergies    Shellfish allergy  Review of Systems   Review of Systems 10 systems reviewed and negative except as per HPI Physical Exam Updated Vital Signs BP (!) 147/104 (BP Location: Right Arm)   Pulse 67   Temp 97.8 F (36.6 C)   Resp 16   Ht 6' (1.829 m)   Wt 112.5 kg   SpO2 100%   BMI 33.63 kg/m   Physical Exam Constitutional:      Comments: Alert nontoxic.  Well-nourished well-developed.  Mental status clear.  HENT:     Head: Normocephalic and atraumatic.     Mouth/Throat:     Mouth: Mucous membranes are moist.     Pharynx: Oropharynx is clear.  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  Right eye symmetric pupil responsive to light otherwise normal eye  Cardiovascular:     Rate and Rhythm: Normal rate and regular rhythm.  Pulmonary:     Effort: Pulmonary effort is normal.     Breath sounds: Normal breath sounds.  Abdominal:     General: There is no distension.     Palpations: Abdomen is soft.     Tenderness: There is no abdominal tenderness. There is no guarding.  Musculoskeletal:        General: No swelling or tenderness. Normal range of motion.     Cervical back: Neck supple.     Right lower leg: No edema.     Left lower leg: No edema.   Skin:    General: Skin is warm and dry.  Neurological:     Comments: Mental status is clear.  Cognitive function is normal.  Speech is clear.  Cranial nerves intact except does appear to have slight right deviation of tongue.  Phonation is clear.  Very subtle strength differential right to left upper extremities.  Patient has excellent general strength.  Right extension against resistance seems slightly less than left.  Lower extremities 5\5 lower extremity strength testing.  Patient does not endorse differential to light touch x4 extremities  Psychiatric:        Mood and Affect: Mood normal.     ED Results / Procedures / Treatments   Labs (all labs ordered are listed, but only abnormal results are displayed) Labs Reviewed  CBC - Abnormal; Notable for the following components:      Result Value   WBC 3.9 (*)    All other components within normal limits  COMPREHENSIVE METABOLIC PANEL - Abnormal; Notable for the following components:   Creatinine, Ser 1.47 (*)    GFR calc non Af Amer 54 (*)    All other components within normal limits  SARS CORONAVIRUS 2 BY RT PCR (HOSPITAL ORDER, [facility])  PROTIME-INR  APTT  DIFFERENTIAL    EKG EKG Interpretation  Date/Time:  Wednesday November 28 2019 10:58:07 EDT Ventricular Rate:  77 PR Interval:    QRS Duration: 92 QT Interval:  382 QTC Calculation: 433 R Axis:   104 Text Interpretation: Sinus rhythm Right axis deviation Anteroseptal infarct, old Borderline repolarization abnormality Borderline ST elevation, lateral leads agree, no STEMI. no old comparison Confirmed by Charlesetta Shanks [phone]) on 11/28/2019 2:29:13 PM   Radiology CT Angio Head W/Cm &/Or Wo Cm  Result Date: 11/28/2019 CLINICAL DATA:  Aphasia. Right arm numbness. Limited spatial awareness. EXAM: CT ANGIOGRAPHY HEAD TECHNIQUE: Multidetector CT imaging of the head was performed using the standard protocol during bolus administration of intravenous  contrast. Multiplanar CT image reconstructions and MIPs were obtained to evaluate the vascular anatomy. CONTRAST:  33mL OMNIPAQUE IOHEXOL 350 MG/ML SOLN COMPARISON:  None. FINDINGS: CT HEAD Brain: Ventricle size normal. Cerebral volume slightly decreased for age. Moderate to extensive white matter disease bilaterally. This is symmetric and appears most consistent with chronic microvascular ischemia. Negative for acute infarct, hemorrhage, mass. Vascular: Negative for hyperdense vessel Skull: Negative Sinuses: Mild mucosal edema left maxillary sinus. Remaining sinuses clear. Orbits: Left ocular prosthesis which is position laterally and  posteriorly. Right orbit normal. CTA HEAD Anterior circulation: Cavernous carotid widely patent without atherosclerotic disease or stenosis. No aneurysm. Anterior and middle cerebral arteries widely patent bilaterally without stenosis. Anterior and middle cerebral arteries widely patent bilaterally without stenosis or large vessel occlusion Posterior circulation: Left vertebral artery dominant and widely patent. Left PICA patent. Small right vertebral artery is patent. Right PICA patent. Basilar widely patent. AICA, superior cerebellar, posterior cerebral arteries patent bilaterally without stenosis or aneurysm. Venous sinuses: Normal venous enhancement. Anatomic variants: None IMPRESSION: 1. Extensive small vessel ischemic change in the white matter which is most likely chronic. Given the patient's symptoms, recommend MRI to evaluate for acute infarct. Correlate with risk factors for small vessel disease. No intracranial hemorrhage. 2. Negative CTA head 3. These results were called by telephone at the time of interpretation on 11/28/2019 at 1:10 pm to provider [facility] , who verbally acknowledged these results. Electronically Signed   By: Marlan Palau M.D.   On: 11/28/2019 13:11    Procedures Procedures (including critical care time)  Medications Ordered in  ED Medications  sodium chloride flush (NS) 0.9 % injection 3 mL (3 mLs Intravenous Not Given 11/28/19 1111)  iohexol (OMNIPAQUE) 350 MG/ML injection 100 mL (80 mLs Intravenous Contrast Given 11/28/19 1241)    ED Course  I have reviewed the triage vital signs and the nursing notes.  Pertinent labs & imaging results that were available during my care of the patient were reviewed by me and considered in my medical decision making (see chart for details).  Clinical Course as of Nov 28 1427  Wed Nov 28, 2019  1207 Consult: Reviewed with Dr. Wilford Corner.  CT angio head and MRI.  Neurology consult when MRI completed.   [MP]    Clinical Course User Index [MP] Arby Barrette, MD  Consult: Dr. Gustavus Messing at [facility] emergency department accepts for transfer for MRI and further evaluation MDM Rules/Calculators/A&P                         Patient presents as outlined with strokelike symptoms that occurred greater than 72 hours ago.  He has residual incoordination with the right hand.  CT angio shows extensive small vessel ischemic disease.  Patient's case has been reviewed with Dr. Jerrell Belfast.  Patient will require transfer for MRI and complete stroke evaluation.  Patient's blood pressures are stable.  Mental status is clear.  Neurologic exam is grossly normal.  Patient is very physically well-developed and strong.  There does appear to be subtle weakness on the right.  Will do ED to ED transfer to proceed with MRI and neurology consultation.  Final Clinical Impression(s) / ED Diagnoses Final diagnoses:  Right arm weakness  Ischemic cerebrovascular disease    Rx / DC Orders ED Discharge Orders    None       Arby Barrette, MD 11/28/19 1432

## 2019-11-29 ENCOUNTER — Observation Stay (HOSPITAL_BASED_OUTPATIENT_CLINIC_OR_DEPARTMENT_OTHER): Payer: No Typology Code available for payment source

## 2019-11-29 DIAGNOSIS — I6782 Cerebral ischemia: Secondary | ICD-10-CM | POA: Diagnosis not present

## 2019-11-29 DIAGNOSIS — E7800 Pure hypercholesterolemia, unspecified: Secondary | ICD-10-CM | POA: Diagnosis not present

## 2019-11-29 DIAGNOSIS — I361 Nonrheumatic tricuspid (valve) insufficiency: Secondary | ICD-10-CM | POA: Diagnosis not present

## 2019-11-29 DIAGNOSIS — I6312 Cerebral infarction due to embolism of basilar artery: Secondary | ICD-10-CM | POA: Diagnosis not present

## 2019-11-29 DIAGNOSIS — I10 Essential (primary) hypertension: Secondary | ICD-10-CM

## 2019-11-29 DIAGNOSIS — I63312 Cerebral infarction due to thrombosis of left middle cerebral artery: Secondary | ICD-10-CM

## 2019-11-29 LAB — BASIC METABOLIC PANEL
Anion gap: 9 (ref 5–15)
BUN: 15 mg/dL (ref 6–20)
CO2: 29 mmol/L (ref 22–32)
Calcium: 9.5 mg/dL (ref 8.9–10.3)
Chloride: 100 mmol/L (ref 98–111)
Creatinine, Ser: 1.45 mg/dL — ABNORMAL HIGH (ref 0.61–1.24)
GFR calc Af Amer: >60 mL/min (ref >60)
GFR calc non Af Amer: 55 mL/min — ABNORMAL LOW (ref >60)
Glucose, Bld: 97 mg/dL (ref 70–99)
Potassium: 3.7 mmol/L (ref 3.5–5.1)
Sodium: 138 mmol/L (ref 135–145)

## 2019-11-29 LAB — LIPID PANEL
Cholesterol: 128 mg/dL (ref 0–200)
HDL: 30 mg/dL — ABNORMAL LOW (ref >40)
LDL Cholesterol: 73 mg/dL (ref 0–99)
Total CHOL/HDL Ratio: 4.3 RATIO
Triglycerides: 126 mg/dL (ref <150)
VLDL: 25 mg/dL (ref 0–40)

## 2019-11-29 LAB — ECHOCARDIOGRAM COMPLETE
Height: 72 in
Weight: 3922.42 oz

## 2019-11-29 LAB — CBC
HCT: 44.1 % (ref 39.0–52.0)
Hemoglobin: 14.7 g/dL (ref 13.0–17.0)
MCH: 30.4 pg (ref 26.0–34.0)
MCHC: 33.3 g/dL (ref 30.0–36.0)
MCV: 91.1 fL (ref 80.0–100.0)
Platelets: 304 10*3/uL (ref 150–400)
RBC: 4.84 MIL/uL (ref 4.22–5.81)
RDW: 13.4 % (ref 11.5–15.5)
WBC: 4 10*3/uL (ref 4.0–10.5)
nRBC: 0 % (ref 0.0–0.2)

## 2019-11-29 LAB — TSH: TSH: 0.838 u[IU]/mL (ref 0.350–4.500)

## 2019-11-29 LAB — HIV ANTIBODY (ROUTINE TESTING W REFLEX): HIV Screen 4th Generation wRfx: NONREACTIVE

## 2019-11-29 LAB — MRSA PCR SCREENING: MRSA by PCR: NEGATIVE

## 2019-11-29 MED ORDER — CLOPIDOGREL BISULFATE 75 MG PO TABS
75.0000 mg | ORAL_TABLET | Freq: Every day | ORAL | Status: DC
  Administered 2019-11-29: 75 mg via ORAL
  Filled 2019-11-29: qty 1

## 2019-11-29 MED ORDER — CLOPIDOGREL BISULFATE 75 MG PO TABS: 75.0000 mg | ORAL_TABLET | Freq: Every day | ORAL | 0 refills | Status: AC

## 2019-11-29 MED ORDER — LISINOPRIL 10 MG PO TABS: 10.0000 mg | ORAL_TABLET | Freq: Every day | ORAL | Status: DC

## 2019-11-29 NOTE — Progress Notes (Signed)
D/c instructions provided to pt. He has no further questions at this time.

## 2019-11-29 NOTE — Progress Notes (Signed)
  Echocardiogram 2D Echocardiogram has been performed.  Jordan Woods 11/29/2019, 10:12 AM

## 2019-11-29 NOTE — Progress Notes (Signed)
SLP Cancellation Note  Patient Details Name: Jordan Woods MRN: [id number] DOB: Sep 29, 1965   Cancelled treatment:       Reason Eval/Treat Not Completed: SLP screened, no needs identified, will sign off.  Pt passed [facility] swallow screen; therefore, no formalized SLP swallow eval is needed per protocol. Will sign off.     Bettejane Leavens I. Vear Clock, MS, CCC-SLP Acute Rehabilitation Services Office number [phone] Pager [phone]  Scheryl Marten 11/29/2019, 3:49 PM

## 2019-11-29 NOTE — Evaluation (Signed)
Physical Therapy Evaluation Patient Details Name: Jordan Woods MRN: [id number] DOB: Apr 03, 1966 Today's Date: 11/29/2019   History of Present Illness  54 y.o. male presenting with acute stroke. PMH is significant for hypertension, hyperlipidemia, leukopenia. 4 days of arm weakness and numbness and slurring of speech.  CTA head was significant for extensive small vessel ischemic change in the white matter, most likely chronic.  MRI performed in the emergency department significant for acute infarct of the posterior limb of the left internal capsule, moderate to severe chronic small vessel ischemic disease and a right chronic thalamic hemorrhage.  Clinical Impression  Pt is close to baseline functioning and should be safe at home. There are no further acute PT needs.  Will sign off at this time.     Follow Up Recommendations No PT follow up    Equipment Recommendations  None recommended by PT    Recommendations for Other Services       Precautions / Restrictions Precautions Precautions: Other (comment) Precaution Comments: L eye prosthetic Restrictions Weight Bearing Restrictions: No      Mobility  Bed Mobility Overal bed mobility: Modified Independent             General bed mobility comments: Use of rail  Transfers Overall transfer level: Needs assistance   Transfers: Sit to/from Stand Sit to Stand: Supervision         General transfer comment: for safety  Ambulation/Gait Ambulation/Gait assistance: Independent (in a home environment) Gait Distance (Feet): 500 Feet Assistive device: None Gait Pattern/deviations: Step-through pattern   Gait velocity interpretation: >4.37 ft/sec, indicative of normal walking speed General Gait Details: Steady, able to safely attain age appropriate speeds, 1-2 minore deviations that don't affect safety.  Stairs Stairs: Yes Stairs assistance: Independent Stair Management: No rails;Alternating pattern;Forwards Number of  Stairs: 5 General stair comments: safe and steady without rails  Wheelchair Mobility    Modified Rankin (Stroke Patients Only) Modified Rankin (Stroke Patients Only) Pre-Morbid Rankin Score: No symptoms Modified Rankin: Slight disability     Balance Overall balance assessment: No apparent balance deficits (not formally assessed)                               Standardized Balance Assessment Standardized Balance Assessment : Berg Balance Test;Dynamic Gait Index Berg Balance Test Sit to Stand: Able to stand without using hands and stabilize independently Standing Unsupported: Able to stand safely 2 minutes Sitting with Back Unsupported but Feet Supported on Floor or Stool: Able to sit safely and securely 2 minutes Stand to Sit: Sits safely with minimal use of hands Transfers: Able to transfer safely, minor use of hands Standing Unsupported with Eyes Closed: Able to stand 10 seconds safely Standing Ubsupported with Feet Together: Able to place feet together independently and stand 1 minute safely From Standing, Reach Forward with Outstretched Arm: Can reach confidently >25 cm (10") From Standing Position, Pick up Object from Floor: Able to pick up shoe safely and easily From Standing Position, Turn to Look Behind Over each Shoulder: Looks behind from both sides and weight shifts well Turn 360 Degrees: Able to turn 360 degrees safely in 4 seconds or less Standing Unsupported, Alternately Place Feet on Step/Stool: Able to stand independently and complete 8 steps >20 seconds Standing Unsupported, One Foot in Front: Able to plae foot ahead of the other independently and hold 30 seconds Standing on One Leg: Able to lift leg independently and hold equal to  or more than 3 seconds Total Score: 52 Dynamic Gait Index Level Surface: Normal Change in Gait Speed: Normal Gait with Horizontal Head Turns: Normal Gait with Vertical Head Turns: Normal Gait and Pivot Turn: Normal Step  Over Obstacle: Normal Step Around Obstacles: Normal Steps: Normal Total Score: 24       Pertinent Vitals/Pain Pain Assessment: No/denies pain    Home Living Family/patient expects to be discharged to:: Private residence Living Arrangements: Spouse/significant other Available Help at Discharge: Family Type of Home: House Home Access: Level entry     Home Layout: Multi-level;Bed/bath upstairs Home Equipment: None Additional Comments: Usually enters through level basement, then kitch on main floor, bedroom/bath on 2nd floor    Prior Function Level of Independence: Independent               Hand Dominance   Dominant Hand: Right    Extremity/Trunk Assessment   Upper Extremity Assessment Upper Extremity Assessment: RUE deficits/detail RUE Deficits / Details: Sensation intact though "tingly" feeling. Strength 4+/5. Proprioception intact, but coordination is poor.  Dysdiadokinesis with R hand.  Initial difficulty with finger to nose, though improved with trials. States he drops things if he stops paying attention to it. RUE Sensation: WNL RUE Coordination: decreased fine motor    Lower Extremity Assessment Lower Extremity Assessment: Defer to PT evaluation       Communication   Communication: No difficulties;Other (comment) (mild expressive difficulty)  Cognition Arousal/Alertness: Awake/alert Behavior During Therapy: WFL for tasks assessed/performed Overall Cognitive Status: Within Functional Limits for tasks assessed                                 General Comments: Able to problem solve, answer higher cognitive level questions      General Comments General comments (skin integrity, edema, etc.): Scores for standardized testing show low risk for falls    Exercises     Assessment/Plan    PT Assessment Patent does not need any further PT services (Pt's function can be managed by OPOT)  PT Problem List Impaired sensation;Decreased  strength;Decreased mobility;Decreased coordination       PT Treatment Interventions      PT Goals (Current goals can be found in the Care Plan section)  Acute Rehab PT Goals Patient Stated Goal: Use R hand better PT Goal Formulation: All assessment and education complete, DC therapy    Frequency     Barriers to discharge        Co-evaluation               AM-PAC PT "6 Clicks" Mobility  Outcome Measure Help needed turning from your back to your side while in a flat bed without using bedrails?: None Help needed moving from lying on your back to sitting on the side of a flat bed without using bedrails?: None Help needed moving to and from a bed to a chair (including a wheelchair)?: None Help needed standing up from a chair using your arms (e.g., wheelchair or bedside chair)?: None Help needed to walk in hospital room?: None Help needed climbing 3-5 steps with a railing? : None 6 Click Score: 24    End of Session   Activity Tolerance: Patient tolerated treatment well Patient left: in chair;with call bell/phone within reach;with family/visitor present Nurse Communication: Mobility status PT Visit Diagnosis: Unsteadiness on feet (R26.81);Hemiplegia and hemiparesis Hemiplegia - Right/Left: Right Hemiplegia - dominant/non-dominant: Dominant Hemiplegia - caused by: Other  cerebrovascular disease    Time: 1226-1246 PT Time Calculation (min) (ACUTE ONLY): 20 min   Charges:   PT Evaluation $PT Eval Moderate Complexity: 1 Mod          11/29/2019  Ginger Carne., PT Acute Rehabilitation Services [phone]  (pager) [phone]  (office)  Tessie Fass Taelor Moncada 11/29/2019, 2:04 PM

## 2019-11-29 NOTE — Plan of Care (Signed)

## 2019-11-29 NOTE — Progress Notes (Signed)
STROKE TEAM PROGRESS NOTE   INTERVAL HISTORY No family at bedside. Pt lying in bed watching TV. He stated that he still has a little clumsy with right hand, otherwise, no issues. He stated that he compliance with ASA and lipitor.   Vitals:   11/28/19 2115 11/28/19 2150 11/29/19 0000 11/29/19 0400  BP: (!) 145/100 (!) 161/106 (!) 147/99 124/90  Pulse: 81 72 65 65  Resp: 20 12 19 16   Temp: 97.9 F (36.6 C) 97.9 F (36.6 C) 97.9 F (36.6 C) 97.9 F (36.6 C)  TempSrc: Oral Oral Oral Oral  SpO2: 99% 98% 97% 99%  Weight:  111.2 kg    Height:  6' (1.829 m)      CBC:  Recent Labs  Lab 11/28/19 1121 11/29/19 0600  WBC 3.9* 4.0  NEUTROABS 1.9  --   HGB 14.2 14.7  HCT 42.6 44.1  MCV 92.8 91.1  PLT 290 304    Basic Metabolic Panel:  Recent Labs  Lab 11/28/19 1121 11/29/19 0600  NA 138 138  K 3.9 3.7  CL 101 100  CO2 28 29  GLUCOSE 92 97  BUN 18 15  CREATININE 1.47* 1.45*  CALCIUM 9.1 9.5   Lipid Panel:     Component Value Date/Time   CHOL 128 11/29/2019 0600   TRIG 126 11/29/2019 0600   HDL 30 (L) 11/29/2019 0600   CHOLHDL 4.3 11/29/2019 0600   VLDL 25 11/29/2019 0600   LDLCALC 73 11/29/2019 0600   HgbA1c: No results found for: HGBA1C Urine Drug Screen: No results found for: LABOPIA, COCAINSCRNUR, LABBENZ, AMPHETMU, THCU, LABBARB  Alcohol Level No results found for: ETH  IMAGING past 24 hours CT Angio Head W/Cm &/Or Wo Cm  Result Date: 11/28/2019 CLINICAL DATA:  Aphasia. Right arm numbness. Limited spatial awareness. EXAM: CT ANGIOGRAPHY HEAD TECHNIQUE: Multidetector CT imaging of the head was performed using the standard protocol during bolus administration of intravenous contrast. Multiplanar CT image reconstructions and MIPs were obtained to evaluate the vascular anatomy. CONTRAST:  8mL OMNIPAQUE IOHEXOL 350 MG/ML SOLN COMPARISON:  None. FINDINGS: CT HEAD Brain: Ventricle size normal. Cerebral volume slightly decreased for age. Moderate to extensive white  matter disease bilaterally. This is symmetric and appears most consistent with chronic microvascular ischemia. Negative for acute infarct, hemorrhage, mass. Vascular: Negative for hyperdense vessel Skull: Negative Sinuses: Mild mucosal edema left maxillary sinus. Remaining sinuses clear. 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 Right orbit normal. CTA HEAD Anterior circulation: Cavernous carotid widely patent without atherosclerotic disease or stenosis. No aneurysm. Anterior and middle cerebral arteries widely patent bilaterally without stenosis. Anterior and middle cerebral arteries widely patent bilaterally without stenosis or large vessel occlusion Posterior circulation: Left vertebral artery dominant and widely patent. Left PICA patent. Small right vertebral artery is patent. Right PICA patent. Basilar widely patent. AICA, superior cerebellar, posterior cerebral arteries patent bilaterally without stenosis or aneurysm. Venous sinuses: Normal venous enhancement. Anatomic variants: None IMPRESSION: 1. Extensive small vessel ischemic change in the white matter which is most likely chronic. Given the patient's symptoms, recommend MRI to evaluate for acute infarct. Correlate with risk factors for small vessel disease. No intracranial hemorrhage. 2. Negative CTA head 3. These results were called by telephone at the time of interpretation on 11/28/2019 at 1:10 pm to provider [facility] , who verbally acknowledged these results. Electronically Signed   By: [facility] M.D.   On: 11/28/2019 13:11   MR Brain Wo Contrast (neuro protocol)  Result Date: 11/28/2019 CLINICAL DATA:  Right upper extremity weakness and incoordination. Speech disturbance. EXAM: MRI HEAD WITHOUT CONTRAST TECHNIQUE: Multiplanar, multiecho pulse sequences of the brain and surrounding structures were obtained without intravenous contrast. COMPARISON:  Head CTA 11/28/2019 FINDINGS: Brain: There is a 1 cm  acute infarct in the posterior limb of the left internal capsule. Patchy and confluent T2 hyperintensities in the cerebral white matter bilaterally are nonspecific but compatible with moderate to severe chronic small vessel ischemic disease, greatly advanced for age. There is a chronic hemorrhage superiorly in the right thalamus. There is mild cerebral atrophy. Vascular: Abnormal appearance of the distal right vertebral artery, shown to be hypoplastic but patent on the earlier CTA. Skull and upper cervical spine: Unremarkable bone marrow signal. Sinuses/Orbits: Left ocular prosthesis. Mild scattered mucosal thickening and small mucous retention cysts in the paranasal sinuses. Clear mastoid air cells. Other: None. IMPRESSION: 1. 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 2. Moderate to severe chronic small vessel ischemic disease. 3. Chronic right thalamic hemorrhage. Electronically Signed   By: Sebastian Ache M.D.   On: 11/28/2019 17:45   VAS US CAROTID  Result Date: 11/29/2019 Carotid Arterial Duplex Study Indications:       CVA. Risk Factors:      Hypertension, hyperlipidemia. Comparison Study:  no prior Performing Technologist: Blanch Media RVS  Examination Guidelines: A complete evaluation includes B-mode imaging, spectral Doppler, color Doppler, and power Doppler as needed of all accessible portions of each vessel. Bilateral testing is considered an integral part of a complete examination. Limited examinations for reoccurring indications may be performed as noted.  Right Carotid Findings: +----------+--------+--------+--------+------------------+--------+           PSV cm/sEDV cm/sStenosisPlaque DescriptionComments +----------+--------+--------+--------+------------------+--------+ CCA Prox  84      18              heterogenous               +----------+--------+--------+--------+------------------+--------+ CCA Distal83      27              heterogenous                +----------+--------+--------+--------+------------------+--------+ ICA Prox  45      22      1-39%   heterogenous               +----------+--------+--------+--------+------------------+--------+ ICA Distal59      20                                         +----------+--------+--------+--------+------------------+--------+ ECA       70      11                                         +----------+--------+--------+--------+------------------+--------+ +----------+--------+-------+--------+-------------------+           PSV cm/sEDV cmsDescribeArm Pressure (mmHG) +----------+--------+-------+--------+-------------------+ [id number]                                         +----------+--------+-------+--------+-------------------+ +---------+--------+--+--------+--+---------+ VertebralPSV cm/s31EDV cm/s10Antegrade +---------+--------+--+--------+--+---------+  Left Carotid Findings: +----------+--------+--------+--------+------------------+--------+           PSV cm/sEDV cm/sStenosisPlaque DescriptionComments +----------+--------+--------+--------+------------------+--------+ CCA  Prox  107     22              heterogenous               +----------+--------+--------+--------+------------------+--------+ CCA Distal79      19              heterogenous               +----------+--------+--------+--------+------------------+--------+ ICA Prox  63      24      1-39%   heterogenous               +----------+--------+--------+--------+------------------+--------+ ICA Distal71      32                                         +----------+--------+--------+--------+------------------+--------+ ECA       86      20                                         +----------+--------+--------+--------+------------------+--------+ +----------+--------+--------+--------+-------------------+           PSV cm/sEDV cm/sDescribeArm Pressure (mmHG)  +----------+--------+--------+--------+-------------------+ [id number]                                          +----------+--------+--------+--------+-------------------+ +---------+--------+--+--------+--+---------+ VertebralPSV cm/s49EDV cm/s18Antegrade +---------+--------+--+--------+--+---------+   Summary: Right Carotid: Velocities in the right ICA are consistent with a 1-39% stenosis. Left Carotid: Velocities in the left ICA are consistent with a 1-39% stenosis. Vertebrals: Bilateral vertebral arteries demonstrate antegrade flow. *See table(s) above for measurements and observations.     Preliminary     PHYSICAL EXAM  Temp:  [97.9 F (36.6 C)-98 F (36.7 C)] 98 F (36.7 C) (06/17 1305) Pulse Rate:  [64-81] 81 (06/17 1305) Resp:  [12-20] 18 (06/17 1305) BP: (124-171)/(80-111) 124/80 (06/17 1305) SpO2:  [97 %-100 %] 100 % (06/17 1305) Weight:  [111.2 kg] 111.2 kg (06/16 2150)  General - Well nourished, well developed, in no apparent distress.  Ophthalmologic - fundi not visualized due to noncooperation.  Cardiovascular - Regular rhythm and rate.  Mental Status -  Level of arousal and orientation to time, place, and person were intact. Language including expression, naming, repetition, comprehension was assessed and found intact. Fund of Knowledge was assessed and was intact.  Cranial Nerves II - XII - II - Visual field intact OU. III, IV, VI - Extraocular movements intact. V - Facial sensation intact bilaterally. VII - right facial droop. VIII - Hearing & vestibular intact bilaterally. X - Palate elevates symmetrically. XI - Chin turning & shoulder shrug intact bilaterally. XII - Tongue protrusion intact.  Motor Strength - The patient's strength was normal in all extremities and pronator drift was absent except right hand dexterity difficulty.  Bulk was normal and fasciculations were absent.   Motor Tone - Muscle tone was assessed at the neck and appendages  and was normal.  Reflexes - The patient's reflexes were symmetrical in all extremities and he had no pathological reflexes.  Sensory - Light touch, temperature/pinprick were assessed and were symmetrical.    Coordination - The patient had normal movements in the hands with no ataxia or dysmetria.  Tremor was  absent.  Gait and Station - deferred.   ASSESSMENT/PLAN [name] is a 54 y.o. male with history of HTN, HLD presenting with 5 days RUE weakness, incoordination and numbness.   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 Extensive Small vessel disease.   CTA head Unremarkable   MRI  L PLIC infarct. Moderate to severe small vessel disease. 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  Carotid Doppler  B ICA 1-39% stenosis, VAs antegrade   2D Echo pending  LDL 73  HgbA1c pending   Lovenox 40 mg sq daily for VTE prophylaxis  ASA 81 prior to admission, now on aspirin 81 mg daily and clopidogrel 75 mg daily. Continue DAPT x 3 weeks then plavix alone  Therapy recommendations:  outpt OT  Disposition:  pending   Hypertension  Stable   . Long-term BP goal normotensive  Hyperlipidemia  Home meds:  lipitor 20   now on lipitor 40   LDL 73, goal < 70  Continue statin at discharge  Other Stroke Risk Factors  ETOH use,  advised to drink no more than 2 drink(s) a day  Obesity, Body mass index is 33.25 kg/m., recommend weight loss, diet and exercise as appropriate   Other Active Problems  Hx traumatic OS injury w/ prosthesis  Hospital day # 0  Neurology will sign off. Please call with questions. Pt will follow up with stroke clinic NP at [facility] in about 4 weeks. Thanks for the consult.  Rosalin Hawking, MD PhD Stroke Neurology 11/29/2019 3:05 PM   To contact Stroke Continuity provider, please refer to [email or web address] After hours, contact General Neurology

## 2019-11-29 NOTE — Progress Notes (Signed)
Family Medicine Teaching Service Daily Progress Note Intern Pager: [phone]  Patient name: Jordan Woods Medical record number: [id number] Date of birth: [birth year] Age: 54 y.o. Gender: male  Primary Care Provider: Patient, No Pcp Per Consultants: Neuro Code Status: Full  Pt Overview and Major Events to Date:  6/16-admitted  Assessment and Plan: [name] is a 54 y.o. male presenting with acute stroke. PMH is significant for hypertension, hyperlipidemia, leukopenia  Stroke Patient is a 54 year old male that presents today after 4 days of arm weakness and numbness and slurring of speech.  CTA head was significant for extensive small vessel ischemic change in the white matter, most likely chronic.  MRI performed in the emergency department significant for acute infarct of the posterior limb of the left internal capsule, moderate to severe chronic small vessel ischemic disease and a right chronic thalamic hemorrhage.  Neurology was consulted in the emergency department due to the acute stroke.  He has a known history of hypertension and a family history significant for stroke in his father, MI in his brother.  He denies any history of palpitations or known arrhythmias. Current A1C pending. TSH within normal limits at 0.838.. Lipid panel shows LDL 73, HDL 30. -Neurology consult, appreciate recommendations  -TTE  -Carotid dopplers  -ASA 81mg , Plavix 75 mg daily  -BP goal 120/80  - Increase atorvastatin to 40mg   -MRA head -SLP, he passed bedside swallow but SLP would still be helpful for further evaluation of his dysarthria -Continuous cardiac monitoring -PT/OT evaluate and treat -Follow-up lipid panel and A1c  Hypertension Blood pressures overnight ranged from 124-161/90-106. Most recent BP 124/90. Home medications include lisinopril-hydrochlorothiazide 20-12.5 mg (2 tabs per day) as well as amlodipine 5 mg/day. Current creatinine 1.45. -Continue home amlodipine, holding  lisinopril-hydrochlorothiazide as patient had contrast yesterday and a mild bump in creatinine. -BP goal 120/80 per neurology  Hyperlipidemia Home medications include atorvastatin 20 mg -We will plan to increase to atorvastatin 40 mg -Follow-up lipid panel  Leukopenia-stable Per care everywhere notes it looks like the plan at 1 time was to refer him for hematology.  Per chart review it appears patient had a consult with hematology on 11/11/2017 and per review it looks like his white count around that time was 3200 with an absolute neutrophil count of 1500.  Per chart review of years the plan was to recheck the CBC in 1 year as this was a likely benign finding.  CBC during this admission significant for white blood cell count of 3.9 with absolute neutrophil count of 1,900. -No further work-up at this time  Question of OSA Per recent office visit notes it appears the patient has had an increase in fatigue in 2019 with patient's wife stating that he snores a lot.  At that time the patient was focusing on trying to lose at least 10% of his body weight.  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  He is currently wearing an eye patch while he does not have a prosthetic eye in place.  FEN/GI: Regular PPx: Lovenox  Disposition: 24 to 48 hours prior to discharge  Subjective:  Patient states he feels his speech has improved from yesterday and does not feel he has to concentrate on it as hard as he did yesterday. He states that his grip seems good but that the thing that  really brought him in was he would drop his coffee yesterday if he was not focusing hard on holding it in his right hand. He denies pains or other complaints at this time.  Objective: Temp:  [97.8 F (36.6 C)-97.9 F (36.6 C)] 97.9 F (36.6 C) (06/17 0400) Pulse Rate:   [64-82] 65 (06/17 0400) Resp:  [12-25] 16 (06/17 0400) BP: (124-171)/(87-111) 124/90 (06/17 0400) SpO2:  [86 %-100 %] 99 % (06/17 0400) Weight:  [111.2 kg-112.5 kg] 111.2 kg (06/16 2150)   Physical Exam: General: Alert and oriented in no apparent distress Heart: Regular rate and rhythm with no murmurs appreciated Lungs: CTA bilaterally, no wheezing Abdomen: Bowel sounds present, no abdominal pain Neuro: CN II to XII intact, patient alert and oriented to person, place, time, situation, fine touch sensation intact in upper and lower extremities bilaterally, strength 5/5 in elbow flexion/extension/grip bilaterally, strength 5/5 in hip flexors bilaterally. Patient with slight lisp noted during speech  Laboratory: Recent Labs  Lab 11/28/19 1121  WBC 3.9*  HGB 14.2  HCT 42.6  PLT 290   Recent Labs  Lab 11/28/19 1121  NA 138  K 3.9  CL 101  CO2 28  BUN 18  CREATININE 1.47*  CALCIUM 9.1  PROT 7.9  BILITOT 0.5  ALKPHOS 44  ALT 24  AST 21  GLUCOSE 92      Imaging/Diagnostic Tests: CT Angio Head W/Cm &/Or Wo Cm  Result Date: 11/28/2019 CLINICAL DATA:  Aphasia. Right arm numbness. Limited spatial awareness. EXAM: CT ANGIOGRAPHY HEAD TECHNIQUE: Multidetector CT imaging of the head was performed using the standard protocol during bolus administration of intravenous contrast. Multiplanar CT image reconstructions and MIPs were obtained to evaluate the vascular anatomy. CONTRAST:  69mL OMNIPAQUE IOHEXOL 350 MG/ML SOLN COMPARISON:  None. FINDINGS: CT HEAD Brain: Ventricle size normal. Cerebral volume slightly decreased for age. Moderate to extensive white matter disease bilaterally. This is symmetric and appears most consistent with chronic microvascular ischemia. Negative for acute infarct, hemorrhage, mass. Vascular: Negative for hyperdense vessel Skull: Negative Sinuses: Mild mucosal edema left maxillary sinus. Remaining sinuses clear. Orbits: Left ocular prosthesis which is position  laterally and posteriorly. Right orbit normal. CTA HEAD Anterior circulation: Cavernous carotid widely patent without atherosclerotic disease or stenosis. No aneurysm. Anterior and middle cerebral arteries widely patent bilaterally without stenosis. Anterior and middle cerebral arteries widely patent bilaterally without stenosis or large vessel occlusion Posterior circulation: Left vertebral artery dominant and widely patent. Left PICA patent. Small right vertebral artery is patent. Right PICA patent. Basilar widely patent. AICA, superior cerebellar, posterior cerebral arteries patent bilaterally without stenosis or aneurysm. Venous sinuses: Normal venous enhancement. Anatomic variants: None IMPRESSION: 1. Extensive small vessel ischemic change in the white matter which is most likely chronic. Given the patient's symptoms, recommend MRI to evaluate for acute infarct. Correlate with risk factors for small vessel disease. No intracranial hemorrhage. 2. Negative CTA head 3. These results were called by telephone at the time of interpretation on 11/28/2019 at 1:10 pm to provider [facility] , who verbally acknowledged these results. Electronically Signed   By: Franchot Gallo M.D.   On: 11/28/2019 13:11   MR Brain Wo Contrast (neuro protocol)  Result Date: 11/28/2019 CLINICAL DATA:  Right upper extremity weakness and incoordination. Speech disturbance. EXAM: MRI HEAD WITHOUT CONTRAST TECHNIQUE: Multiplanar, multiecho pulse sequences of the brain and surrounding structures were obtained without intravenous contrast. COMPARISON:  Head CTA 11/28/2019 FINDINGS: Brain: There is a 1 cm acute infarct in the  posterior limb of the left internal capsule. Patchy and confluent T2 hyperintensities in the cerebral white matter bilaterally are nonspecific but compatible with moderate to severe chronic small vessel ischemic disease, greatly advanced for age. There is a chronic hemorrhage superiorly in the right thalamus. There is  mild cerebral atrophy. Vascular: Abnormal appearance of the distal right vertebral artery, shown to be hypoplastic but patent on the earlier CTA. Skull and upper cervical spine: Unremarkable bone marrow signal. Sinuses/Orbits: Left ocular prosthesis. Mild scattered mucosal thickening and small mucous retention cysts in the paranasal sinuses. Clear mastoid air cells. Other: None. IMPRESSION: 1. 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 2. Moderate to severe chronic small vessel ischemic disease. 3. Chronic right thalamic hemorrhage. Electronically Signed   By: Sebastian Ache M.D.   On: 11/28/2019 17:45     Jackelyn Poling, DO 11/29/2019, 6:18 AM PGY-1, [facility] FPTS Intern pager: [phone], text pages welcome

## 2019-11-29 NOTE — Progress Notes (Signed)
Carotid duplex has been completed.   Preliminary results in CV Proc.   Jordan Woods Jordan Woods 11/29/2019 10:00 AM

## 2019-11-29 NOTE — Progress Notes (Signed)
Occupational Therapy Evaluation Patient Details Name: Jordan Woods MRN: [id number] DOB: 1965-08-25 Today's Date: 11/29/2019    History of Present Illness 54 y.o. male presenting with acute stroke. PMH is significant for hypertension, hyperlipidemia, leukopenia. 4 days of arm weakness and numbness and slurring of speech.  CTA head was significant for extensive small vessel ischemic change in the white matter, most likely chronic.  MRI performed in the emergency department significant for acute infarct of the posterior limb of the left internal capsule, moderate to severe chronic small vessel ischemic disease and a right chronic thalamic hemorrhage.   Clinical Impression   Patient lives in a multi level home with wife.  He is independent at baseline with no devices.  Today patient presenting with decreased coordination in R UE.  Patient having difficulty with finger to nose, presenting with dysdiadochokinesis, and stating he has been dropping objects often if he does not focus (not witnessed during eval when attempted to elicit).   Patient's sensation, proprioception, and strength all WLN.  Patient also stated he has been hitting R arm/shouler on door frames/walls, though patient not presenting with any peripheral vision loss.  Poor coordination affecting patient's independence with ADLs requiring fine motor use like grooming and eating.  Patient able to complete mobility with modified independence/supervision.  Will continue to follow with OT acutely to address the deficits listed below.      Follow Up Recommendations  Outpatient OT    Equipment Recommendations  None recommended by OT    Recommendations for Other Services       Precautions / Restrictions Precautions Precautions: Other (comment) Precaution Comments: L eye prosthetic Restrictions Weight Bearing Restrictions: No      Mobility Bed Mobility Overal bed mobility: Modified Independent             General bed mobility  comments: Use of rail  Transfers Overall transfer level: Needs assistance   Transfers: Sit to/from Stand Sit to Stand: Supervision         General transfer comment: for safety    Balance Overall balance assessment: No apparent balance deficits (not formally assessed)                                         ADL either performed or assessed with clinical judgement   ADL Overall ADL's : Needs assistance/impaired Eating/Feeding: Set up;Sitting Eating/Feeding Details (indicate cue type and reason): Difficulty coordinating openng containers Grooming: Standing;Set up;Supervision/safety   Upper Body Bathing: Supervision/ safety;Standing   Lower Body Bathing: Supervison/ safety;Sit to/from stand   Upper Body Dressing : Supervision/safety;Standing   Lower Body Dressing: Supervision/safety;Sit to/from stand               Functional mobility during ADLs: Supervision/safety General ADL Comments: Supervision for ADLs at this time as a precaution due to poor coordination.       Vision Baseline Vision/History:  (L eye prosthetic) Patient Visual Report: No change from baseline Vision Assessment?: Yes Ocular Range of Motion: Within Functional Limits Tracking/Visual Pursuits: Able to track stimulus in all quads without difficulty Visual Fields: No apparent deficits     Perception     Praxis      Pertinent Vitals/Pain Pain Assessment: No/denies pain     Hand Dominance Right   Extremity/Trunk Assessment Upper Extremity Assessment Upper Extremity Assessment: RUE deficits/detail RUE Deficits / Details: Sensation intact though "tingly" feeling. Strength 4+/5. Proprioception  intact, but coordination is poor.  Dysdiadokinesis with R hand.  Initial difficulty with finger to nose, though improved with trials. States he drops things if he stops paying attention to it. RUE Sensation: WNL RUE Coordination: decreased fine motor   Lower Extremity Assessment Lower  Extremity Assessment: Defer to PT evaluation       Communication Communication Communication: No difficulties;Other (comment) (mild expressive difficulty)   Cognition Arousal/Alertness: Awake/alert Behavior During Therapy: WFL for tasks assessed/performed Overall Cognitive Status: Within Functional Limits for tasks assessed                                 General Comments: Able to problem solve, answer higher cognitive level questions   General Comments       Exercises     Shoulder Instructions      Home Living Family/patient expects to be discharged to:: Private residence Living Arrangements: Spouse/significant other Available Help at Discharge: Family Type of Home: House Home Access: Level entry     Home Layout: Multi-level;Bed/bath upstairs Alternate Level Stairs-Number of Steps: 2 flights Alternate Level Stairs-Rails: Can reach both Bathroom Shower/Tub: Tub/shower unit         Home Equipment: None   Additional Comments: Usually enters through level basement, then kitch on main floor, bedroom/bath on 2nd floor      Prior Functioning/Environment Level of Independence: Independent                 OT Problem List: Decreased coordination;Impaired UE functional use      OT Treatment/Interventions: Self-care/ADL training;Therapeutic exercise;Neuromuscular education;DME and/or AE instruction;Therapeutic activities;Patient/family education    OT Goals(Current goals can be found in the care plan section) Acute Rehab OT Goals Patient Stated Goal: Use R hand better OT Goal Formulation: With patient Time For Goal Achievement: 12/13/19 Potential to Achieve Goals: Good  OT Frequency: Min 2X/week   Barriers to D/C:            Co-evaluation              AM-PAC OT "6 Clicks" Daily Activity     Outcome Measure Help from another person eating meals?: A Little Help from another person taking care of personal grooming?: A Little Help from  another person toileting, which includes using toliet, bedpan, or urinal?: A Little Help from another person bathing (including washing, rinsing, drying)?: A Little Help from another person to put on and taking off regular upper body clothing?: A Little Help from another person to put on and taking off regular lower body clothing?: A Little 6 Click Score: 18   End of Session Nurse Communication: Mobility status  Activity Tolerance: Patient tolerated treatment well Patient left: in bed;with call bell/phone within reach  OT Visit Diagnosis: Other symptoms and signs involving the nervous system (O27.035)                Time: 0093-8182 OT Time Calculation (min): 16 min Charges:  OT General Charges $OT Visit: 1 Visit OT Evaluation $OT Eval Moderate Complexity: 1 Mod  Barbie Banner, OTR/L   Adella Hare 11/29/2019, 1:07 PM

## 2019-11-29 NOTE — Discharge Instructions (Signed)
You admitted to the hospital for work-up of your stroke.  While you were here, we verified that you did have a small stroke in the middle of your brain.  This stroke was most likely caused by poorly controlled high blood pressure over the course of years.  We have modified some of your home medications and added 1 new medication while you were here.  Here is a quick summary of things he needs now to go home today:  -Start taking an antiplatelet medication called Plavix.  We recommend that you take this medication indefinitely in order to prevent future strokes. -Continue taking aspirin 81 mg daily for the next 3 weeks then stop taking aspirin. -You are currently taking 3 blood pressure medications at home.  Continue to take these blood pressure medications and make sure to visit your doctor regularly to ensure that your blood pressure stays well controlled in the outpatient setting. -Increase your cholesterol medication to atorvastatin 40 mg daily.

## 2019-11-30 LAB — HEMOGLOBIN A1C
Hgb A1c MFr Bld: 5.6 % (ref 4.8–5.6)
Mean Plasma Glucose: 114 mg/dL

## 2019-12-05 NOTE — Discharge Summary (Signed)
Family Medicine [facility] Discharge Summary  Patient name: Jordan Woods Medical record number: [id number] Date of birth: 09-04-1965 Age: 54 y.o. Gender: male Date of Admission: 11/28/2019  Date of Discharge: 11/29/2019 Admitting Physician: Westley Chandlerarina M Brown, MD  Primary Care Provider: Patient, No Pcp Per Consultants: Neurology  Indication for Hospitalization: Stroke  Discharge Diagnoses/Problem List:  Active Problems:   Stroke [facility](HCC)  Disposition: Home  Discharge Condition: Stable  Discharge Exam:  General: Alert and oriented in no apparent distress Heart: Regular rate and rhythm with no murmurs appreciated Lungs: CTA bilaterally, no wheezing Abdomen: Bowel sounds present, no abdominal pain Neuro: CN II to XII intact, patient alert and oriented to person, place, time, situation, fine touch sensation intact in upper and lower extremities bilaterally, strength 5/5 in elbow flexion/extension/grip bilaterally, strength 5/5 in hip flexors bilaterally. Patient with slight lisp noted during speech  Brief Hospital Course:  Patient admitted after 4 days of arm weakness and numbness and some slurring of the speech.  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  Neurology was consulted.  Neurology recommended continuing with blood pressure goal of 120/80, using aspirin 81 mg, Plavix 75 mg daily and increase in atorvastatin to 40 mg.  After 3 weeks neurology recommends continuing Plavix alone.  Neurology then signed off with patient being stable and outside the window for any interventions to be done for his stroke.  Hypertension Per neurology recommendation patient should maintain blood pressure goal of 120/80 going forward.  Patient's home medications included lisinopril-hydrochlorothiazide 20-12.5 mg 2 tabs per day as well as amlodipine 5  mg/day.  Issues for Follow Up:  1. Maintain blood pressure goal of 120/80 2. During his hospitalization patient had his atorvastatin increased to 40 mg 3. Per neurology continue aspirin 81 mg, Plavix 75 mg daily for 3 weeks and then Plavix alone  Significant Procedures: None  Significant Labs and Imaging:  Recent Labs  Lab 11/28/19 1121 11/29/19 0600  WBC 3.9* 4.0  HGB 14.2 14.7  HCT 42.6 44.1  PLT 290 304   Recent Labs  Lab 11/28/19 1121 11/29/19 0600  NA 138 138  K 3.9 3.7  CL 101 100  CO2 28 29  GLUCOSE 92 97  BUN 18 15  CREATININE 1.47* 1.45*  CALCIUM 9.1 9.5  ALKPHOS 44  --   AST 21  --   ALT 24  --   ALBUMIN 4.1  --     CT Angio Head W/Cm &/Or Wo Cm  Result Date: 11/28/2019 CLINICAL DATA:  Aphasia. Right arm numbness. Limited spatial awareness. EXAM: CT ANGIOGRAPHY HEAD TECHNIQUE: Multidetector CT imaging of the head was performed using the standard protocol during bolus administration of intravenous contrast. Multiplanar CT image reconstructions and MIPs were obtained to evaluate the vascular anatomy. CONTRAST:  80mL OMNIPAQUE IOHEXOL 350 MG/ML SOLN COMPARISON:  None. FINDINGS: CT HEAD Brain: Ventricle size normal. Cerebral volume slightly decreased for age. Moderate to extensive white matter disease bilaterally. This is symmetric and appears most consistent with chronic microvascular ischemia. Negative for acute infarct, hemorrhage, mass. Vascular: Negative for hyperdense vessel Skull: Negative Sinuses: Mild mucosal edema left maxillary sinus. Remaining sinuses clear. 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 Right orbit normal. CTA HEAD Anterior circulation: Cavernous carotid widely patent without atherosclerotic disease or stenosis. No aneurysm. Anterior and middle cerebral arteries widely patent bilaterally  without stenosis. Anterior and middle cerebral arteries widely patent bilaterally without stenosis or large vessel occlusion  Posterior circulation: Left vertebral artery dominant and widely patent. Left PICA patent. Small right vertebral artery is patent. Right PICA patent. Basilar widely patent. AICA, superior cerebellar, posterior cerebral arteries patent bilaterally without stenosis or aneurysm. Venous sinuses: Normal venous enhancement. Anatomic variants: None IMPRESSION: 1. Extensive small vessel ischemic change in the white matter which is most likely chronic. Given the patient's symptoms, recommend MRI to evaluate for acute infarct. Correlate with risk factors for small vessel disease. No intracranial hemorrhage. 2. Negative CTA head 3. These results were called by telephone at the time of interpretation on 11/28/2019 at 1:10 pm to provider [facility] , who verbally acknowledged these results. Electronically Signed   By: Franchot Gallo M.D.   On: 11/28/2019 13:11   MR Brain Wo Contrast (neuro protocol)  Result Date: 11/28/2019 CLINICAL DATA:  Right upper extremity weakness and incoordination. Speech disturbance. EXAM: MRI HEAD WITHOUT CONTRAST TECHNIQUE: Multiplanar, multiecho pulse sequences of the brain and surrounding structures were obtained without intravenous contrast. 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 Patchy and confluent T2 hyperintensities in the cerebral white matter bilaterally are nonspecific but compatible with moderate to severe chronic small vessel ischemic disease, greatly advanced for age. There is a chronic hemorrhage superiorly in the right thalamus. There is mild cerebral atrophy. Vascular: Abnormal appearance of the distal right vertebral artery, shown to be hypoplastic but patent on the earlier CTA. Skull and upper cervical spine: Unremarkable bone marrow signal. Sinuses/Orbits: Left ocular prosthesis. Mild scattered mucosal thickening and small mucous retention cysts in the paranasal sinuses. Clear mastoid  air cells. Other: None. IMPRESSION: 1. 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 2. Moderate to severe chronic small vessel ischemic disease. 3. Chronic right thalamic hemorrhage. Electronically Signed   By: Logan Bores M.D.   On: 11/28/2019 17:45   ECHOCARDIOGRAM COMPLETE  Result Date: 11/29/2019    ECHOCARDIOGRAM REPORT   Patient Name:   [facility] Date of Exam: 11/29/2019 Medical Rec #:  [id number]     Height:       72.0 in Accession #:    [id number]    Weight:       245.1 lb Date of Birth:  06-Dec-1965     BSA:          2.323 m Patient Age:    40 years      BP:           124/90 mmHg Patient Gender: M             HR:           71 bpm. Exam Location:  Inpatient Procedure: 2D Echo, Cardiac Doppler and Color Doppler Indications:    Stroke 434.91 / I163.9  History:        Patient has no prior history of Echocardiogram examinations.                 Stroke; Risk Factors:Non-Smoker.  Sonographer:    Vickie Epley RDCS Referring Phys: [id number] [address]LL[address] [place]  1. Left ventricular ejection fraction, by estimation, is 60 to 65%. The left ventricle has normal function. The left ventricle has no regional wall motion abnormalities. There is mild left ventricular hypertrophy. Left ventricular diastolic parameters were normal.  2. Right ventricular systolic function is normal. The  right ventricular size is normal.  3. The mitral valve is normal in structure. Trivial mitral valve regurgitation. No evidence of mitral stenosis.  4. The aortic valve is normal in structure. Aortic valve regurgitation is not visualized. No aortic stenosis is present.  5. The inferior vena cava is normal in size with greater than 50% respiratory variability, suggesting right atrial pressure of 3 mmHg. FINDINGS  Left Ventricle: Left ventricular ejection fraction, by estimation, is 60 to 65%. The left ventricle has normal function. The left ventricle has no regional wall motion abnormalities. The left ventricular  internal cavity size was normal in size. There is  mild left ventricular hypertrophy. Left ventricular diastolic parameters were normal. Right Ventricle: The right ventricular size is normal. No increase in right ventricular wall thickness. Right ventricular systolic function is normal. Left Atrium: Left atrial size was normal in size. Right Atrium: Right atrial size was normal in size. Pericardium: There is no evidence of pericardial effusion. Mitral Valve: The mitral valve is normal in structure. Normal mobility of the mitral valve leaflets. Trivial mitral valve regurgitation. No evidence of mitral valve stenosis. Tricuspid Valve: The tricuspid valve is normal in structure. Tricuspid valve regurgitation is mild . No evidence of tricuspid stenosis. Aortic Valve: The aortic valve is normal in structure. Aortic valve regurgitation is not visualized. No aortic stenosis is present. Pulmonic Valve: The pulmonic valve was normal in structure. Pulmonic valve regurgitation is not visualized. No evidence of pulmonic stenosis. Aorta: The aortic root is normal in size and structure. Venous: The inferior vena cava is normal in size with greater than 50% respiratory variability, suggesting right atrial pressure of 3 mmHg. IAS/Shunts: No atrial level shunt detected by color flow Doppler.  LEFT VENTRICLE PLAX 2D LVIDd:         3.80 cm      Diastology LVIDs:         2.80 cm      LV e' lateral:   7.71 cm/s LV PW:         1.00 cm      LV E/e' lateral: 5.9 LV IVS:        1.00 cm      LV e' medial:    6.22 cm/s LVOT diam:     2.20 cm      LV E/e' medial:  7.4 LV SV:         69 LV SV Index:   30 LVOT Area:     3.80 cm  LV Volumes (MOD) LV vol d, MOD A2C: 131.0 ml LV vol d, MOD A4C: 130.0 ml LV vol s, MOD A2C: 54.5 ml LV vol s, MOD A4C: 58.3 ml LV SV MOD A2C:     76.5 ml LV SV MOD A4C:     130.0 ml LV SV MOD BP:      72.6 ml RIGHT VENTRICLE RV S prime:     11.70 cm/s TAPSE (M-mode): 1.7 cm LEFT ATRIUM             Index       RIGHT  ATRIUM           Index LA diam:        3.70 cm 1.59 cm/m  RA Area:     14.10 cm LA Vol (A2C):   36.5 ml 15.71 ml/m RA Volume:   32.60 ml  14.03 ml/m LA Vol (A4C):   30.3 ml 13.04 ml/m LA Biplane Vol: 36.0 ml 15.50 ml/m  AORTIC VALVE LVOT  Vmax:   113.00 cm/s LVOT Vmean:  71.900 cm/s LVOT VTI:    0.181 m  AORTA Ao Asc diam: 3.50 cm MITRAL VALVE MV Area (PHT): 2.56 cm    SHUNTS MV Decel Time: 296 msec    Systemic VTI:  0.18 m MV E velocity: 45.80 cm/s  Systemic Diam: 2.20 cm MV A velocity: 45.00 cm/s MV E/A ratio:  1.02 Charlton Haws MD Electronically signed by Charlton Haws MD Signature Date/Time: 11/29/2019/5:08:23 PM    Final    VAS US CAROTID  Result Date: 11/30/2019 Carotid Arterial Duplex Study Indications:       CVA. Risk Factors:      Hypertension, hyperlipidemia. Comparison Study:  no prior Performing Technologist: Blanch Media RVS  Examination Guidelines: A complete evaluation includes B-mode imaging, spectral Doppler, color Doppler, and power Doppler as needed of all accessible portions of each vessel. Bilateral testing is considered an integral part of a complete examination. Limited examinations for reoccurring indications may be performed as noted.  Right Carotid Findings: +----------+--------+--------+--------+------------------+--------+           PSV cm/sEDV cm/sStenosisPlaque DescriptionComments +----------+--------+--------+--------+------------------+--------+ CCA Prox  84      18              heterogenous               +----------+--------+--------+--------+------------------+--------+ CCA Distal83      27              heterogenous               +----------+--------+--------+--------+------------------+--------+ ICA Prox  45      22      1-39%   heterogenous               +----------+--------+--------+--------+------------------+--------+ ICA Distal59      20                                          +----------+--------+--------+--------+------------------+--------+ ECA       70      11                                         +----------+--------+--------+--------+------------------+--------+ +----------+--------+-------+--------+-------------------+           PSV cm/sEDV cmsDescribeArm Pressure (mmHG) +----------+--------+-------+--------+-------------------+ [id number]                                         +----------+--------+-------+--------+-------------------+ +---------+--------+--+--------+--+---------+ VertebralPSV cm/s31EDV cm/s10Antegrade +---------+--------+--+--------+--+---------+  Left Carotid Findings: +----------+--------+--------+--------+------------------+--------+           PSV cm/sEDV cm/sStenosisPlaque DescriptionComments +----------+--------+--------+--------+------------------+--------+ CCA Prox  107     22              heterogenous               +----------+--------+--------+--------+------------------+--------+ CCA Distal79      19              heterogenous               +----------+--------+--------+--------+------------------+--------+ ICA Prox  63      24      1-39%   heterogenous               +----------+--------+--------+--------+------------------+--------+  ICA Distal71      32                                         +----------+--------+--------+--------+------------------+--------+ ECA       86      20                                         +----------+--------+--------+--------+------------------+--------+ +----------+--------+--------+--------+-------------------+           PSV cm/sEDV cm/sDescribeArm Pressure (mmHG) +----------+--------+--------+--------+-------------------+ [id number]                                          +----------+--------+--------+--------+-------------------+ +---------+--------+--+--------+--+---------+ VertebralPSV cm/s49EDV cm/s18Antegrade  +---------+--------+--+--------+--+---------+   Summary: Right Carotid: Velocities in the right ICA are consistent with a 1-39% stenosis. Left Carotid: Velocities in the left ICA are consistent with a 1-39% stenosis. Vertebrals: Bilateral vertebral arteries demonstrate antegrade flow. *See table(s) above for measurements and observations.  Electronically signed by Delia Heady MD on 11/30/2019 at 11:00:01 AM.    Final    Results/Tests Pending at Time of Discharge:   Discharge Medications:  Allergies as of 11/29/2019      Reactions   Shellfish Allergy Anaphylaxis      Medication List    TAKE these medications   amLODipine 5 MG tablet Commonly known as: NORVASC Take 5 mg by mouth daily.   aspirin EC 81 MG tablet Take 81 mg by mouth daily. Swallow whole.   atorvastatin 20 MG tablet Commonly known as: LIPITOR Take 1 tablet by mouth daily.   clopidogrel 75 MG tablet Commonly known as: PLAVIX Take 1 tablet (75 mg total) by mouth daily.   lisinopril-hydrochlorothiazide 20-12.5 MG tablet Commonly known as: ZESTORETIC Take 2 tablets by mouth daily.   pantoprazole 40 MG tablet Commonly known as: PROTONIX Take 40 mg by mouth daily.   sildenafil 20 MG tablet Commonly known as: REVATIO Take 1-5 tablets by mouth as needed. 30 minutes prior to sexual activity       Discharge Instructions: Please refer to Patient Instructions section of EMR for full details.  Patient was counseled important signs and symptoms that should prompt return to medical care, changes in medications, dietary instructions, activity restrictions, and follow up appointments.   Follow-Up Appointments:  Follow-up Information    [facility]. Schedule an appointment as soon as possible for a visit in 4 week(s).   Specialty: Neurology Contact information: [address] [place] [address] [phone]              Jackelyn Poling, DO 12/05/2019, 8:48 AM PGY-1, [facility]  Health Family Medicine

## 2020-01-01 ENCOUNTER — Ambulatory Visit: Payer: No Typology Code available for payment source | Admitting: Adult Health

## 2020-01-01 ENCOUNTER — Encounter: Payer: Self-pay | Admitting: Adult Health

## 2020-01-01 VITALS — BP 137/88 | HR 76 | Ht 72.0 in | Wt 251.0 lb

## 2020-01-01 DIAGNOSIS — I639 Cerebral infarction, unspecified: Secondary | ICD-10-CM | POA: Diagnosis not present

## 2020-01-01 DIAGNOSIS — I10 Essential (primary) hypertension: Secondary | ICD-10-CM

## 2020-01-01 DIAGNOSIS — E785 Hyperlipidemia, unspecified: Secondary | ICD-10-CM

## 2020-01-01 NOTE — Patient Instructions (Addendum)
Continue clopidogrel 75 mg daily  and atorvastatin 80 mg daily for secondary stroke prevention  Discontinue aspirin as 3 week duration completed  Continue to follow up with PCP regarding cholesterol and blood pressure management   Continue to do exercises at home as well as additional exercises provided today  Maintain strict control of hypertension with blood pressure goal below 130/90 and cholesterol with LDL cholesterol (bad cholesterol) goal below 70 mg/dL. I also advised the patient to eat a healthy diet with plenty of whole grains, cereals, fruits and vegetables, exercise regularly and maintain ideal body weight.  Followup in the future with me in 6 months or call earlier if needed       Thank you for coming to see [place] at [facility]. I hope we have been able to provide you high quality care today.  You may receive a patient satisfaction survey over the next few weeks. We would appreciate your feedback and comments so that we may continue to improve ourselves and the health of our patients.

## 2020-01-01 NOTE — Progress Notes (Signed)
[facility] [address] [place]. [place] [address] [phone]       HOSPITAL FOLLOW UP NOTE  [name] Date of Birth:  Aug 17, 1965 Medical Record Number:  [id number]   Reason for Referral:  hospital stroke follow up    SUBJECTIVE:   CHIEF COMPLAINT:  Chief Complaint  Patient presents with  . Follow-up    Rm 9 here for a f/u. Pt still having right hand weakness    HPI:   [name] is a 54 y.o. male with history of HTN, HLD  presented on 11/28/2019 with 5 days RUE weakness, incoordination and numbness.  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  Recommended DAPT for 3 weeks and Plavix.  History of HTN stable.  History of HLD with LDL 73 and increase atorvastatin from 20 mg to 40 mg. Other stroke risk factors include EtOH and obesity.  Evaluated by therapies and recommended outpatient OT and discharged home in stable condition.  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 Extensive Small vessel disease.   CTA head Unremarkable   MRI  L PLIC infarct. Moderate to severe small vessel disease. 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  Carotid Doppler  B ICA 1-39% stenosis, VAs antegrade   2D Echo EF 60 to 65%  LDL 73 -increase atorvastatin to 40 mg daily  HgbA1c 5.6   Lovenox 40 mg sq daily for VTE prophylaxis  ASA 81 prior to admission, now on aspirin 81 mg daily and clopidogrel 75 mg daily. Continue DAPT x 3 weeks then plavix alone  Therapy recommendations:  outpt OT  Disposition:  home  Today, 01/01/2020 [name] is being seen for hospital follow-up.  Residual deficits right hand weakness with ongoing improvement.  Currently doing exercises at home.  Declines participation in formal therapy.  Completed 3 weeks DAPT but has continued on both aspirin and Plavix and denies bleeding or bruising.   PCP recently increased atorvastatin dosage to 80 mg daily  which he has continued without side effects.  Blood pressure today 137/88.  No concerns at this time.       ROS:   14 system review of systems performed and negative with exception of weakness  PMH:  Past Medical History:  Diagnosis Date  . High cholesterol   . Hypertension     PSH:  Past Surgical History:  Procedure Laterality Date  . APPENDECTOMY    . EYE SURGERY      Social History:  Social History   Socioeconomic History  . Marital status: Married    Spouse name: Not on file  . Number of children: Not on file  . Years of education: Not on file  . Highest education level: Not on file  Occupational History  . Not on file  Tobacco Use  . Smoking status: Never Smoker  . Smokeless tobacco: Never Used  Substance and Sexual Activity  . Alcohol use: Yes    Comment: social  . Drug use: No  . Sexual activity: Yes  Other Topics Concern  . Not on file  Social History Narrative  . Not on file   Social Determinants of Health   Financial Resource Strain:   . Difficulty of Paying Living Expenses:   Food Insecurity:   . Worried About Programme researcher, broadcasting/film/video in the Last Year:   . Barista in the Last Year:   [employer]  Needs:   . Lack of Transportation (Medical):   Marland Kitchen Lack of Transportation (Non-Medical):   Physical Activity:   . Days of Exercise per Week:   . Minutes of Exercise per Session:   Stress:   . Feeling of Stress :   Social Connections:   . Frequency of Communication with Friends and Family:   . Frequency of Social Gatherings with Friends and Family:   . Attends Religious Services:   . Active Member of Clubs or Organizations:   . Attends Banker Meetings:   Marland Kitchen Marital Status:   Intimate Partner Violence:   . Fear of Current or Ex-Partner:   . Emotionally Abused:   Marland Kitchen Physically Abused:   . Sexually Abused:     Family History: No family history on file.  Medications:   Current Outpatient Medications on File Prior to Visit   Medication Sig Dispense Refill  . atorvastatin (LIPITOR) 80 MG tablet Take 80 mg by mouth daily.    Marland Kitchen amLODipine (NORVASC) 5 MG tablet Take 5 mg by mouth daily.     . clopidogrel (PLAVIX) 75 MG tablet Take 1 tablet (75 mg total) by mouth daily. 60 tablet 0  . lisinopril-hydrochlorothiazide (ZESTORETIC) 20-12.5 MG tablet Take 2 tablets by mouth daily.    . pantoprazole (PROTONIX) 40 MG tablet Take 40 mg by mouth daily.     . sildenafil (REVATIO) 20 MG tablet Take 1-5 tablets by mouth as needed. 30 minutes prior to sexual activity     No current facility-administered medications on file prior to visit.    Allergies:   Allergies  Allergen Reactions  . Shellfish Allergy Anaphylaxis      OBJECTIVE:  Physical Exam  Vitals:   01/01/20 1521  BP: 137/88  Pulse: 76  Weight: 251 lb (113.9 kg)  Height: 6' (1.829 m)   Body mass index is 34.04 kg/m. No exam data present  General: well developed, well nourished,  pleasant middle-age African-American male, seated, in no evident distress Head: head normocephalic and atraumatic.   Neck: supple with no carotid or supraclavicular bruits Cardiovascular: regular rate and rhythm, no murmurs Musculoskeletal: no deformity Skin:  no rash/petichiae Vascular:  Normal pulses all extremities   Neurologic Exam Mental Status: Awake and fully alert. Oriented to place and time. Recent and remote memory intact. Attention span, concentration and fund of knowledge appropriate. Mood and affect appropriate.  Cranial Nerves: Fundoscopic exam reveals sharp disc margins. Pupils equal, briskly reactive to light. Extraocular movements full without nystagmus. Visual fields full to confrontation. Hearing intact. Facial sensation intact. Face, tongue, palate moves normally and symmetrically.  Motor: Normal bulk and tone. Normal strength in all tested extremity muscles. Sensory.: intact to touch , pinprick , position and vibratory sensation.  Coordination: Rapid  alternating movements normal in all extremities except slightly decreased right hand dexterity and fine motor control. Finger-to-nose and heel-to-shin performed accurately bilaterally. Gait and Station: Arises from chair without difficulty. Stance is normal. Gait demonstrates normal stride length and balance Reflexes: 1+ and symmetric. Toes downgoing.     NIHSS  0 Modified Rankin  1     ASSESSMENT: Jordan Woods is a 54 y.o. 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 Vascular risk factors include HTN, HLD and prior stroke on imaging.      PLAN:  1. L PLIC stroke :  -Residual deficits: Mild decreased right  hand motor control.  Reports continued improvement.  Declines interest in outpatient therapy at this time.  Encourage continue exercise at home and contact us if wishes to pursue -Continue clopidogrel 75 mg daily  and atorvastatin 80 mg daily for secondary stroke prevention. -Advised to discontinue aspirin as 3 weeks DAPT completed and prolonged DAPT not indicated  -Close follow-up with PCP for aggressive stroke risk factor monitoring and management 2. HTN: BP goal<130/90.  Stable today. f/u with PCP 3. HLD: LDL goal<70.  Continue atorvastatin 80 mg daily with ongoing follow-up with PCP for prescribing, monitoring management     Follow up in 6 months or call earlier if needed   I spent 45 minutes of face-to-face and non-face-to-face time with patient.  This included previsit chart review, lab review, study review, order entry, electronic health record documentation, patient education regarding recent stroke, residual deficits, importance of managing stroke risk factors and answered all questions to patient satisfaction     Ihor Austin, [facility]  [facility] [address] [place], [place]  [address]  Phone [phone] Fax [phone] Note: This document was prepared with digital dictation and possible smart phrase technology. Any transcriptional errors that result from this process are unintentional.

## 2020-07-03 ENCOUNTER — Ambulatory Visit: Payer: No Typology Code available for payment source | Admitting: Adult Health

## 2020-07-31 ENCOUNTER — Ambulatory Visit: Payer: No Typology Code available for payment source | Admitting: Adult Health

## 2020-08-05 ENCOUNTER — Ambulatory Visit (INDEPENDENT_AMBULATORY_CARE_PROVIDER_SITE_OTHER): Payer: No Typology Code available for payment source | Admitting: Adult Health

## 2020-08-05 ENCOUNTER — Encounter: Payer: Self-pay | Admitting: Adult Health

## 2020-08-05 ENCOUNTER — Other Ambulatory Visit: Payer: Self-pay

## 2020-08-05 VITALS — BP 152/96 | HR 67 | Ht 72.0 in | Wt 248.0 lb

## 2020-08-05 DIAGNOSIS — I639 Cerebral infarction, unspecified: Secondary | ICD-10-CM | POA: Diagnosis not present

## 2020-08-05 DIAGNOSIS — E785 Hyperlipidemia, unspecified: Secondary | ICD-10-CM

## 2020-08-05 DIAGNOSIS — R413 Other amnesia: Secondary | ICD-10-CM | POA: Diagnosis not present

## 2020-08-05 DIAGNOSIS — I69319 Unspecified symptoms and signs involving cognitive functions following cerebral infarction: Secondary | ICD-10-CM | POA: Diagnosis not present

## 2020-08-05 NOTE — Progress Notes (Signed)
[facility] [address] [place]. [place] [address] [phone]       STROKE FOLLOW UP NOTE  [name] Date of Birth:  1965-08-20 Medical Record Number:  [id number]   Reason for Referral: stroke follow up    SUBJECTIVE:   CHIEF COMPLAINT:  Chief Complaint  Patient presents with  . Follow-up    TR alone Pt is well, states he is having in the moment memory lost like he looses his train of thought     HPI:   Today, 08/05/2020, Jordan Woods returns for 56-month stroke follow-up unaccompanied.  He reports complete resolution of RUE weakness but has been experiencing memory loss as well as increased anxiety which has been present since his stroke. evaluated by PCP 04/2020 and received FMLA for stress, anxiety and memory issues post stroke.  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    History provided for reference purposes only Initial visit 01/01/2020 JM: Jordan Woods is being seen for hospital follow-up.  Residual deficits right hand weakness with ongoing improvement.  Currently doing exercises at home.  Declines participation in formal therapy.  Completed 3 weeks DAPT but has continued on both aspirin and Plavix and denies bleeding or bruising.   PCP recently increased atorvastatin dosage to 80 mg daily which he has  continued without side effects.  Blood pressure today 137/88.  No concerns at this time.  Stroke admission 11/28/2019 Jordan Woods is a 55 y.o. male with history of HTN, HLD  presented on 11/28/2019 with 5 days RUE weakness, incoordination and numbness.  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  Recommended DAPT for 3 weeks and Plavix.  History of HTN stable.  History of HLD with LDL 73 and increase atorvastatin from 20 mg to 40 mg. Other stroke risk factors include EtOH and obesity.  Evaluated by therapies and recommended outpatient OT and discharged home in stable condition.  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 Extensive Small vessel disease.   CTA head Unremarkable   MRI  L PLIC infarct. Moderate to severe small vessel disease. 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  Carotid Doppler  B ICA 1-39% stenosis, VAs antegrade   2D Echo EF 60 to 65%  LDL 73 -increase atorvastatin to 40 mg daily  HgbA1c 5.6   Lovenox 40 mg sq daily for VTE prophylaxis  ASA 81 prior to admission, now on aspirin 81 mg daily and clopidogrel 75 mg daily. Continue DAPT x 3 weeks then plavix alone  Therapy recommendations:  outpt OT  Disposition:  home       ROS:   14 system review of systems performed and negative with exception of memory loss  PMH:  Past Medical History:  Diagnosis Date  . High cholesterol   .  Hypertension     PSH:  Past Surgical History:  Procedure Laterality Date  . APPENDECTOMY    . EYE SURGERY      Social History:  Social History   Socioeconomic History  . Marital status: Married    Spouse name: Not on file  . Number of children: Not on file  . Years of education: Not on file  . Highest education level: Not on file  Occupational History  . Not on file  Tobacco Use  . Smoking status: Never Smoker  . Smokeless tobacco: Never Used  Substance and Sexual Activity  .  Alcohol use: Yes    Comment: social  . Drug use: No  . Sexual activity: Yes  Other Topics Concern  . Not on file  Social History Narrative  . Not on file   Social Determinants of Health   Financial Resource Strain: Not on file  Food Insecurity: Not on file  Transportation Needs: Not on file  Physical Activity: Not on file  Stress: Not on file  Social Connections: Not on file  Intimate Partner Violence: Not on file    Family History: History reviewed. No pertinent family history.  Medications:   Current Outpatient Medications on File Prior to Visit  Medication Sig Dispense Refill  . amLODipine (NORVASC) 5 MG tablet Take 5 mg by mouth daily.     Marland Kitchen atorvastatin (LIPITOR) 80 MG tablet Take 80 mg by mouth daily.    . clopidogrel (PLAVIX) 75 MG tablet Take 1 tablet (75 mg total) by mouth daily. 60 tablet 0  . lisinopril-hydrochlorothiazide (ZESTORETIC) 20-12.5 MG tablet Take 2 tablets by mouth daily.    . pantoprazole (PROTONIX) 40 MG tablet Take 40 mg by mouth daily.     . sildenafil (REVATIO) 20 MG tablet Take 1-5 tablets by mouth as needed. 30 minutes prior to sexual activity     No current facility-administered medications on file prior to visit.    Allergies:   Allergies  Allergen Reactions  . Shellfish Allergy Anaphylaxis      OBJECTIVE:  Physical Exam  Vitals:   08/05/20 0940  BP: (!) 152/96  Pulse: 67  Weight: 248 lb (112.5 kg)  Height: 6' (1.829 m)   Body mass index is 33.63 kg/m. No exam data present  General: well developed, well nourished,  pleasant middle-age African-American male, seated, in no evident distress Head: head normocephalic and atraumatic.   Neck: supple with no carotid or supraclavicular bruits Cardiovascular: regular rate and rhythm, no murmurs Musculoskeletal: no deformity Skin:  no rash/petichiae Vascular:  Normal pulses all extremities   Neurologic Exam Mental Status: Awake and fully alert.   Fluent speech and language.   Oriented to place and time. Recent memory subjectively impaired and remote memory intact. Attention span, concentration and fund of knowledge appropriate during visit. Mood and affect appropriate.  Cranial Nerves: Pupils equal, briskly reactive to light. Extraocular movements full without nystagmus. Visual fields full to confrontation. Hearing intact. Facial sensation intact. Face, tongue, palate moves normally and symmetrically.  Motor: Normal bulk and tone. Normal strength in all tested extremity muscles. Sensory.: intact to touch , pinprick , position and vibratory sensation.  Coordination: Rapid alternating movements normal in all extremities.  Finger-to-nose and heel-to-shin performed accurately bilaterally. Gait and Station: Arises from chair without difficulty. Stance is normal. Gait demonstrates normal stride length and balance without use of assistive device Reflexes: 1+ and symmetric. Toes downgoing.       ASSESSMENT: Jordan Woods is a  55 y.o. 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 Vascular risk factors include HTN, HLD and prior stroke on imaging.      PLAN:  1. L PLIC stroke :  a. Residual deficits: Cognitive complaints.  Discussed likely post stroke but underlying anxiety and multiple stressors may be contributing.  Discussed benefit of therapy/counseling but he declines interest at this time.  Discussed importance of stress reduction techniques with additional formation provided.  Also discussed importance of memory exercises and managing stroke risk factors. Will check B12 to rule out deficiency.  TSH during hospitalization WNL b. Continue clopidogrel 75 mg daily  and atorvastatin 80 mg daily for secondary stroke prevention. c. Discussed secondary stroke prevention measures and importance of close follow-up with PCP for aggressive stroke  risk factor monitoring and management 2. HTN: BP goal<130/90.  Slightly elevated today although stable at home.  Monitor by PCP 3. HLD: LDL goal<70.  Continue atorvastatin 80 mg daily.  Repeat lipid panel today and if satisfactory, will consider decreasing atorvastatin dosage   Follow up in 4 months or call earlier if needed   CC:  [facility] provider: Dr. Murriel Hopper, [place], [place]    I spent 35 minutes of face-to-face and non-face-to-face time with patient.  This included previsit chart review, lab review, study review, order entry, electronic health record documentation, patient education regarding prior stroke, cognitive complaints and underlying causes, interventions to do at home and further education regarding cognitive and anxiety complaints, importance of managing stroke risk factors and answered all other questions to patient satisfaction   Ihor Austin, [facility]  [facility] [address] [place], [place] [address]  Phone [phone] Fax [phone] Note: This document was prepared with digital dictation and possible smart phrase technology. Any transcriptional errors that result from this process are unintentional.

## 2020-08-05 NOTE — Patient Instructions (Signed)
Your memory concerns could very likely be coming from your prior stroke but also underlying stressors and anxiety could be playing a large role.  Would recommend considering benefit with therapy or counseling which can be further discussed with your PCP.  Additional information provided below.  Continue clopidogrel 75 mg daily  and atorvastatin 80 mg daily for secondary stroke prevention  Repeat lipid panel today - if satisfactory, we can consider lowering dosage We will also check your B12 level as b12 deficiency can contribute to memory issues  Continue to follow up with PCP regarding cholesterol and blood pressure management  Maintain strict control of hypertension with blood pressure goal below 130/90 and cholesterol with LDL cholesterol (bad cholesterol) goal below 70 mg/dL.       Followup in the future with me in 4 months or call earlier if needed       Thank you for coming to see [place] at [facility]. I hope we have been able to provide you high quality care today.  You may receive a patient satisfaction survey over the next few weeks. We would appreciate your feedback and comments so that we may continue to improve ourselves and the health of our patients.      Emotional Health After Stroke Your emotional health may change after a stroke. You may have fear, anxiety, anger, sadness, and other feelings. Some of these changes happen because a stroke can damage your brain and nervous system. You may also have these feelings because coping with a change in your health can feel overwhelming. Depression and other emotional changes can slow your recovery after a stroke. It is important to recognize the symptoms so that you can take steps to strengthen your emotional health. What are some common emotions after a stroke? You may have:  Fear.  Anxiety.  Anger.  Frustration.  Sadness.  Feelings of loss or grief.  Depression.  Crying or laughing at the  wrong time or wrong situation (pseudobulbar affect, orPBA). What are the symptoms of depression? Depression after a stroke can happen right away, or it can show up later. Symptoms of depression may include:  Sleep problems.  Changes in normal eating habits, such as eating too much or too little.  Weight gain or weight loss.  Not having energy or enthusiasm (lethargy).  Feeling very tired (fatigue).  Avoiding people and activities (social withdrawal).  Irritability.  Crying more than usual.  Mood swings.  Not being able to concentrate.  Feeling hopeless.  Hating yourself.  Having suicidal thoughts. If you ever feel like you may hurt yourself or others, or have thoughts about taking your own life, get help right away. You can go to your nearest emergency department or call:  Your local emergency services (911 in the [place]  A suicide crisis helpline, such as the National Suicide Prevention Lifeline at 6087760552. This is open 24 hours a day. What increases my risk of depression? Having a stroke raises the risk of depression. The risk also goes up if you:  Are socially isolated.  Have a family history of depression.  Have a history of depression or other mental health problems before the stroke.  Are unable to work or do activities that you previously enjoyed.  Need help from others for daily activities.  Use drugs or drink alcohol.  Take certain medicines, such as sleeping pills or high blood pressure medicines. What are some coping methods I can use? Ask your health care provider for help. Your  health care provider may recommend treatments for depression, such as:  Talk therapy or counseling with a mental health professional. This may include cognitive behavioral therapy (CBT) to help change your patterns of thinking.  Medical therapies, such as brain stimulation or light therapy.  Lifestyle changes, such as eating a healthy diet and avoiding  alcohol.  Antidepressant medicines.  Alternative therapies, such as acupuncture, music therapy, or pet therapy. Other coping strategies you may try include:  Physical therapy or exercises. Try to do some exercise every day.  Writing your thoughts in a journal. An example might be keeping track of unrealistic thoughts or keeping a log of things you are thankful for.  Practicing good sleep habits, such as getting up at the same time every day.  Following a predictable routine each day.  Participating in activities that make you laugh.  Mindfulness therapy. This may include meditation and other techniques to lower your stress.  Joining a support group for people who are recovering from a stroke. These groups provide social interaction and help you feel connected to others. Your health care team can help you find a support group in your area.   Summary  Your emotional health may change after a stroke. You may have fear, anxiety, anger, sadness, and other feelings.  It is important to recognize the symptoms of depression and other emotional problems.  If you experience emotional changes, let your loved ones know and contact your health care provider. This information is not intended to replace advice given to you by your health care provider. Make sure you discuss any questions you have with your health care provider. Document Revised: 05/13/2017 Document Reviewed: 09/03/2016 Elsevier Patient Education  2021 [employer].

## 2020-08-06 ENCOUNTER — Other Ambulatory Visit: Payer: Self-pay | Admitting: Adult Health

## 2020-08-06 LAB — LIPID PANEL
Chol/HDL Ratio: 4 ratio (ref 0.0–5.0)
Cholesterol, Total: 141 mg/dL (ref 100–199)
HDL: 35 mg/dL — ABNORMAL LOW (ref >39)
LDL Chol Calc (NIH): 86 mg/dL (ref 0–99)
Triglycerides: 108 mg/dL (ref 0–149)
VLDL Cholesterol Cal: 20 mg/dL (ref 5–40)

## 2020-08-06 LAB — VITAMIN B12: Vitamin B-12: 347 pg/mL (ref 232–1245)

## 2020-08-06 MED ORDER — VITAMIN B-12 1000 MCG PO TABS: 1000.0000 ug | ORAL_TABLET | Freq: Every day | ORAL | Status: DC

## 2020-08-06 NOTE — Progress Notes (Signed)
I agree with the above plan 

## 2020-12-08 ENCOUNTER — Telehealth: Payer: Self-pay | Admitting: Adult Health

## 2020-12-08 ENCOUNTER — Other Ambulatory Visit: Payer: Self-pay

## 2020-12-08 ENCOUNTER — Encounter: Payer: Self-pay | Admitting: Adult Health

## 2020-12-08 ENCOUNTER — Ambulatory Visit: Payer: No Typology Code available for payment source | Admitting: Adult Health

## 2020-12-08 VITALS — BP 158/88 | HR 72 | Ht 72.0 in | Wt 250.0 lb

## 2020-12-08 DIAGNOSIS — E785 Hyperlipidemia, unspecified: Secondary | ICD-10-CM

## 2020-12-08 DIAGNOSIS — E538 Deficiency of other specified B group vitamins: Secondary | ICD-10-CM | POA: Diagnosis not present

## 2020-12-08 DIAGNOSIS — R404 Transient alteration of awareness: Secondary | ICD-10-CM

## 2020-12-08 DIAGNOSIS — I639 Cerebral infarction, unspecified: Secondary | ICD-10-CM | POA: Diagnosis not present

## 2020-12-08 DIAGNOSIS — I10 Essential (primary) hypertension: Secondary | ICD-10-CM

## 2020-12-08 MED ORDER — DIVALPROEX SODIUM ER 500 MG PO TB24: 500.0000 mg | ORAL_TABLET | Freq: Every day | ORAL | 5 refills | Status: AC

## 2020-12-08 NOTE — Progress Notes (Signed)
[facility] [address] [place]. [place] [address] [phone]       STROKE FOLLOW UP NOTE  [name] Date of Birth:  [birth year] Medical Record Number:  [id number]   Reason for Referral: stroke follow up    SUBJECTIVE:   CHIEF COMPLAINT:  Chief Complaint  Patient presents with   Follow-up    RM 14 alone Pt is well, having moments of forgetfulness/ "lost/spaced out"      HPI:   Today, 12/08/2020, Jordan Woods returns for 35-month stroke follow-up.  Stable from stroke standpoint without new stroke/TIA symptoms.  Reports residual cognitive impairment but today reports cognitive complaints are more episodes of altered level of awareness with confusion.  He started to experience these in February but have been more persistent.  More recently, he is able to tell when they may occur describing it as feeling " flustered" but " difficult to explain".  Episodes can last 5 to 10 seconds and then will return back to baseline without any residual symptoms.  Example of episode yesterday: Went into kitchen, grabbed something out of the fridge, set on counter and planned to walk into the bathroom but next thing he knew, he was standing in front of the kitchen trash can and had the lid lifted (usually uses his foot to open) about to urinate.  He denies any other associated symptoms such as headache, weakness, visual changes, loss of consciousness or shaking.  At times, episodes can occur multiple times throughout the day and other days he may not experience any.  At times can be associated with increased stressors but not always.  Denies any personal or family history of seizures.  Denies any excessive EtOH use or drug use.  Prior complaints of cognitive difficulties.  B12 level checked which was on the lower side of normal at 347 therefore recommended supplementing with vitamin B12 1000 mcg daily.  Compliant on Plavix and atorvastatin without associated side effects.  Blood  pressure today 158/88.  No further concerns at this time.     History provided for reference purposes only Update 08/05/2020 JM: Jordan Woods returns for 68-month stroke follow-up unaccompanied.  He reports complete resolution of RUE weakness but has been experiencing memory loss as well as increased anxiety which has been present since his stroke. evaluated by PCP 04/2020 and received FMLA for stress, anxiety and memory issues post stroke.  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  Initial visit 01/01/2020 JM: Jordan Woods is being seen for hospital follow-up.  Residual deficits right hand weakness with ongoing improvement.  Currently doing exercises at home.  Declines participation in formal therapy.  Completed 3 weeks DAPT but has continued on both aspirin and Plavix and denies bleeding or bruising.   PCP recently increased atorvastatin dosage to 80 mg daily which he has continued without side effects.  Blood  pressure today 137/88.  No concerns at this time.  Stroke admission 11/28/2019 Jordan Woods is a 55 y.o. male with history of HTN, HLD  presented on 11/28/2019 with 5 days RUE weakness, incoordination and numbness.  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  Recommended DAPT for 3 weeks and Plavix.  History of  HTN stable.  History of HLD with LDL 73 and increase atorvastatin from 20 mg to 40 mg. Other stroke risk factors include EtOH and obesity.  Evaluated by therapies and recommended outpatient OT and discharged home in stable condition.  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 Extensive Small vessel disease.  CTA head Unremarkable  MRI  L PLIC infarct. Moderate to severe small vessel disease. 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 Carotid Doppler  B ICA 1-39% stenosis, VAs antegrade  2D Echo EF 60 to 65% LDL 73 -increase atorvastatin to 40 mg daily HgbA1c 5.6  Lovenox 40 mg sq daily for VTE prophylaxis ASA 81 prior to admission, now on aspirin 81 mg daily and clopidogrel 75 mg daily. Continue DAPT x 3 weeks then plavix alone Therapy recommendations:  outpt OT Disposition:  home       ROS:   14 system review of systems performed and negative with exception of those listed in HPI  PMH:  Past Medical History:  Diagnosis Date   High cholesterol    Hypertension     PSH:  Past Surgical History:  Procedure Laterality Date   APPENDECTOMY     EYE SURGERY      Social History:  Social History   Socioeconomic History   Marital status: Married    Spouse name: Not on file   Number of children: Not on file   Years of education: Not on file   Highest education level: Not on file  Occupational History   Not on file  Tobacco Use   Smoking status: Never   Smokeless tobacco: Never  Substance and Sexual Activity   Alcohol use: Yes    Comment: social   Drug use: No   Sexual activity: Yes  Other Topics Concern   Not on file  Social History Narrative   Not on file   Social Determinants of Health   Financial Resource Strain: Not on file  Food Insecurity: Not on file  Transportation Needs: Not on file  Physical Activity: Not on file  Stress: Not on file  Social Connections: Not on file  Intimate Partner Violence: Not on file     Family History: History reviewed. No pertinent family history.  Medications:   Current Outpatient Medications on File Prior to Visit  Medication Sig Dispense Refill   atorvastatin (LIPITOR) 80 MG tablet Take 80 mg by mouth daily.     clopidogrel (PLAVIX) 75 MG tablet Take 1 tablet (75 mg total) by mouth daily. 60 tablet 0   lisinopril-hydrochlorothiazide (ZESTORETIC) 20-12.5 MG tablet Take 2 tablets by mouth daily.     Multiple Vitamins-Minerals (MENS 50+ MULTI VITAMIN/MIN PO) Take by mouth.     vitamin B-12 (CYANOCOBALAMIN) 1000 MCG tablet Take 1,000 mcg by mouth daily.     No current facility-administered medications on file prior to visit.    Allergies:   Allergies  Allergen Reactions   Shellfish Allergy Anaphylaxis      OBJECTIVE:  Physical Exam  Vitals:   12/08/20 0931  BP: (!) 158/88  Pulse: 72  Weight: 250 lb (  113.4 kg)  Height: 6' (1.829 m)    Body mass index is 33.91 kg/m. No results found.  General: well developed, well nourished,  pleasant middle-age African-American male, seated, in no evident distress Head: head normocephalic and atraumatic.   Neck: supple with no carotid or supraclavicular bruits Cardiovascular: regular rate and rhythm, no murmurs Musculoskeletal: no deformity Skin:  no rash/petichiae Vascular:  Normal pulses all extremities   Neurologic Exam Mental Status: Awake and fully alert.   Fluent speech and language.  Oriented to place and time. Recent memory subjectively impaired and remote memory intact. Attention span, concentration and fund of knowledge appropriate during visit. Mood and affect appropriate.  Cranial Nerves: Pupils equal, briskly reactive to light. Extraocular movements full without nystagmus. Visual fields full to confrontation. Hearing intact. Facial sensation intact. Face, tongue, palate moves normally and symmetrically.  Motor: Normal bulk and tone. Normal strength in all tested extremity muscles. Sensory.: intact  to touch , pinprick , position and vibratory sensation.  Coordination: Rapid alternating movements normal in all extremities.  Finger-to-nose and heel-to-shin performed accurately bilaterally. Gait and Station: Arises from chair without difficulty. Stance is normal. Gait demonstrates normal stride length and balance without use of assistive device Reflexes: 1+ and symmetric. Toes downgoing.       ASSESSMENT: Jordan Woods is a 55 y.o. 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 Vascular risk factors include HTN, HLD and prior stroke on imaging.      PLAN:  L PLIC stroke :  Continue clopidogrel 75 mg daily  and atorvastatin 80 mg daily for secondary stroke prevention. Discussed secondary stroke prevention measures and importance of close follow-up with PCP for aggressive stroke risk factor monitoring and management Transient alteration of awareness: Concern of possible seizure type activity possibly in setting of prior stroke. Obtain MR brain w/wo contrast to rule out any other contributing causes.  Obtain EEG.  Obtain CMP and CBC.  Recommend trial of Depakote ER 500 mg nightly.  Advised to call office after 1 week if symptoms persist for possible need of dosage increase or any difficulty tolerating HTN: BP goal<130/90.  Slightly elevated today although stable at home.  Monitor by PCP HLD: LDL goal<70.  LDL 86 (07/2020) - repeeat today. Continue atorvastatin 80 mg daily.  Vitamin B12 deficiency: Mild deficiency at 347 -continue supplementation 1000 mcg daily -repeat level today    Follow up in 4 months or call earlier if needed   CC:  [facility] provider: Dr. Murriel Hopper, [place], [place]    I spent 39 minutes of face-to-face and non-face-to-face time with patient.  This included previsit chart review, lab review, study review, order entry, electronic health  record documentation, patient education and regarding prior stroke with importance of aggressive stroke risk factor management and secondary stroke prevention, transient alteration of awareness of possible causes and further evaluation and answered all other questions to patient satisfaction   Jordan Woods, AGNP-BC  [facility] [address] [place], [place] [address]  Phone [phone] Fax [phone] Note: This document was prepared with digital dictation and possible smart phrase technology. Any transcriptional errors that result from this process are unintentional.

## 2020-12-08 NOTE — Patient Instructions (Addendum)
Concerned that your transient altered awareness may be in setting of seizures which can be from your prior stroke but recommend repeat brain imaging with and without contrast to rule out any other abnormalities as well as EEG which will look for seizures.  We will also obtain lab work to look for other causes.  Recommend starting Depakote 500 mg nightly - if after 1 week, you continue to experience episodes, please let me know for possible need of dosage increase  Continue clopidogrel 75 mg daily  and atorvastatin  for secondary stroke prevention  Continue to follow up with PCP regarding cholesterol and blood pressure management  Maintain strict control of hypertension with blood pressure goal below 130/90 and cholesterol with LDL cholesterol (bad cholesterol) goal below 70 mg/dL.       Followup in the future with me in 2 months or call earlier if needed       [facility]. I hope we have been able to provide you high quality care today.  You may receive a patient satisfaction survey over the next few weeks. We would appreciate your feedback and comments so that we may continue to improve ourselves and the health of our patients.   Seizure, Adult A seizure is a sudden burst of abnormal electrical and chemical activity in thebrain. Seizures usually last from 30 seconds to 2 minutes.  What are the causes? Common causes of this condition include: Fever or infection. Problems that affect the brain. These may include: A brain or head injury. Bleeding in the brain. A brain tumor. Low levels of blood sugar or salt. Kidney problems or liver problems. Conditions that are passed from parent to child (are inherited). Problems with a substance, such as: Having a reaction to a drug or a medicine. Stopping the use of a substance all of a sudden (withdrawal). A stroke. Disorders that affect how you develop. Sometimes, the cause may not be  known.  What increases the risk? Having someone in your family who has epilepsy. In this condition, seizures happen again and again over time. They have no clear cause. Having had a tonic-clonic seizure before. This type of seizure causes you to: Tighten the muscles of the whole body. Lose consciousness. Having had a head injury or strokes before. Having had a lack of oxygen at birth. What are the signs or symptoms? There are many types of seizures. The symptoms vary depending on the type ofseizure you have. Symptoms during a seizure Shaking that you cannot control (convulsions) with fast, jerky movements of muscles. Stiffness of the body. Breathing problems. Feeling mixed up (confused). Staring or not responding to sound or touch. Head nodding. Eyes that blink, flutter, or move fast. Drooling, grunting, or making clicking sounds with your mouth Losing control of when you pee or poop. Symptoms before a seizure Feeling afraid, nervous, or worried. Feeling like you may vomit. Feeling like: You are moving when you are not. Things around you are moving when they are not. Feeling like you saw or heard something before (dj vu). Odd tastes or smells. Changes in how you see. You may see flashing lights or spots. Symptoms after a seizure Feeling confused. Feeling sleepy. Headache. Sore muscles. How is this treated? If your seizure stops on its own, you will not need treatment. If your seizure lasts longer than 5 minutes, you will normally need treatment. Treatment may include: Medicines given through an IV tube. Avoiding things, such as  medicines, that are known to cause your seizures. Medicines to prevent seizures. A device to prevent or control seizures. Surgery. A diet low in carbohydrates and high in fat (ketogenic diet). Follow these instructions at home: Medicines Take over-the-counter and prescription medicines only as told by your doctor. Avoid foods or drinks that may  keep your medicine from working, such as alcohol. Activity Follow instructions about driving, swimming, or doing things that would be dangerous if you had another seizure. Wait until your doctor says it is safe for you to do these things. If you live in the [place] ask your local department of motor vehicles when you can drive. Get a lot of rest. Teaching others  Teach friends and family what to do when you have a seizure. They should: Help you get down to the ground. Protect your head and body. Loosen any clothing around your neck. Turn you on your side. Know whether or not you need emergency care. Stay with you until you are better. Also, tell them what not to do if you have a seizure. Tell them: They should not hold you down. They should not put anything in your mouth.  General instructions Avoid anything that gives you seizures. Keep a seizure diary. Write down: What you remember about each seizure. What you think caused each seizure. Keep all follow-up visits. Contact a doctor if: You have another seizure or seizures. Call the doctor each time you have a seizure. The pattern of your seizures changes. You keep having seizures with treatment. You have symptoms of being sick or having an infection. You are not able to take your medicine. Get help right away if: You have any of these problems: A seizure that lasts longer than 5 minutes. Many seizures in a row and you do not feel better between seizures. A seizure that makes it harder to breathe. A seizure and you can no longer speak or use part of your body. You do not wake up right after a seizure. You get hurt during a seizure. You feel confused or have pain right after a seizure. These symptoms may be an emergency. Get help right away. Call your local emergency services (911 in the [place] Do not wait to see if the symptoms will go away. Do not drive yourself to the hospital. Summary A seizure is a sudden burst of abnormal  electrical and chemical activity in the brain. Seizures normally last from 30 seconds to 2 minutes. Causes of seizures include illness, injury to the head, low levels of blood sugar or salt, and certain conditions. Most seizures will stop on their own in less than 5 minutes. Seizures that last longer than 5 minutes are a medical emergency and need treatment right away. Many medicines are used to treat seizures. Take over-the-counter and prescription medicines only as told by your doctor. This information is not intended to replace advice given to you by your health care provider. Make sure you discuss any questions you have with your healthcare provider. Document Revised: 12/07/2019 Document Reviewed: 12/07/2019 Elsevier Patient Education  2022 Elsevier Inc.    Valproic Acid, Divalproex Sodium capsules What is this medication? VALPROIC ACID (val PROE ik AS id) is used to treat certain types of seizures inpatients with epilepsy. This medicine may be used for other purposes; ask your health care provider orpharmacist if you have questions. COMMON BRAND NAME(S): Depakene What should I tell my care team before I take this medication? They need to know if you have any  of these conditions: if you often drink alcohol kidney disease liver disease low platelet counts mitochondrial disease suicidal thoughts, plans, or attempt; a previous suicide attempt by you or a family member urea cycle disorder (UCD) an unusual or allergic reaction to divalproex sodium, sodium valproate, valproic acid, other medicines, foods, dyes, or preservatives pregnant or trying to get pregnant breast-feeding How should I use this medication? Take this medicine by mouth with a glass of water. Follow the directions on the prescription label. Do not cut, crush or chew this medicine. You can take it with or without food. If it upsets your stomach, take it with food. Take your doses at regular intervals. Do not take your  medicine more often than directed.Do not stop taking except on your doctor's advice. A special MedGuide will be given to you by the pharmacist with eachprescription and refill. Be sure to read this information carefully each time. Talk to your pediatrician regarding the use of this medicine in children. While this drug may be prescribed for children as young as 10 years for selectedconditions, precautions do apply. Overdosage: If you think you have taken too much of this medicine contact apoison control center or emergency room at once. NOTE: This medicine is only for you. Do not share this medicine with others. What if I miss a dose? If you miss a dose, take it as soon as you can. If it is almost time for yournext dose, take only that dose. Do not take double or extra doses. What may interact with this medication? Do not take this medicine with any of the following medications: sodium phenylbutyrate This medicine may also interact with the following medications: aspirin certain antibiotics like ertapenem, imipenem, meropenem certain medicines for depression, anxiety, or psychotic disturbances certain medicines for seizures like carbamazepine, clonazepam, diazepam, ethosuximide, felbamate, lamotrigine, phenobarbital, phenytoin, primidone, rufinamide, topiramate certain medicines that treat or prevent blood clots like warfarin cholestyramine male hormones, like estrogens and birth control pills, patches, or rings propofol rifampin ritonavir tolbutamide zidovudine This list may not describe all possible interactions. Give your health care provider a list of all the medicines, herbs, non-prescription drugs, or dietary supplements you use. Also tell them if you smoke, drink alcohol, or use illegaldrugs. Some items may interact with your medicine. What should I watch for while using this medication? Tell your doctor or health care provider if your symptoms do not get better orthey start to get  worse. This medicine may cause serious skin reactions. They can happen weeks to months after starting the medicine. Contact your health care provider right away if you notice fevers or flu-like symptoms with a rash. The rash may be red or purple and then turn into blisters or peeling of the skin. Or, you might notice a red rash with swelling of the face, lips or lymph nodes in your neck or underyour arms. Wear a medical ID bracelet or chain, and carry a card that describes yourdisease and details of your medicine and dosage times. You may get drowsy, dizzy, or have blurred vision. Do not drive, use machinery, or do anything that needs mental alertness until you know how this medicine affects you. To reduce dizzy or fainting spells, do not sit or stand up quickly, especially if you are an older patient. Alcohol can increasedrowsiness and dizziness. Avoid alcoholic drinks. This medicine can make you more sensitive to the sun. Keep out of the sun. If you cannot avoid being in the sun, wear protective clothing and use sunscreen.Do  not use sun lamps or tanning beds/booths. Patients and their families should watch out for new or worsening depression or thoughts of suicide. Also watch out for sudden changes in feelings such as feeling anxious, agitated, panicky, irritable, hostile, aggressive, impulsive, severely restless, overly excited and hyperactive, or not being able to sleep. If this happens, especially at the beginning of treatment or after a change indose, call your health care provider. Women should inform their doctor if they wish to become pregnant or think they might be pregnant. There is a potential for serious side effects to an unborn child. Talk to your health care provider or pharmacist for more information. Women who become pregnant while using this medicine may enroll in the [place] Antiepileptic Drug Pregnancy Registry by calling ([phone]. This registry collects information about the  safety of antiepileptic drug use duringpregnancy. This medicine may cause a decrease in folic acid and vitamin D. You should make sure that you get enough vitamins while you are taking this medicine. Discussthe foods you eat and the vitamins you take with your health care provider. What side effects may I notice from receiving this medication? Side effects that you should report to your doctor or health care professionalas soon as possible: allergic reactions like skin rash, itching or hives, swelling of the face, lips, or tongue changes in vision rash, fever, and swollen lymph nodes redness, blistering, peeling or loosening of the skin, including inside the mouth signs and symptoms of liver injury like dark yellow or brown urine; general ill feeling or flu-like symptoms; light-colored stools; loss of appetite; nausea; right upper belly pain; unusually weak or tired; yellowing of the eyes or skin suicidal thoughts or other mood changes unusual bleeding or bruising Side effects that usually do not require medical attention (report to yourdoctor or health care professional if they continue or are bothersome): constipation diarrhea dizziness hair loss headache loss of appetite weight gain This list may not describe all possible side effects. Call your doctor for medical advice about side effects. You may report side effects to FDA at1-800-FDA-1088. Where should I keep my medication? Keep out of reach of children. Store at room temperature between 15 and 25 degrees C (59 and 77 degrees F). Keep container tightly closed. Throw away any unused medicine after theexpiration date. NOTE: This sheet is a summary. It may not cover all possible information. If you have questions about this medicine, talk to your doctor, pharmacist, orhealth care provider.  2022 Elsevier/Gold Standard (2018-09-08 16:01:44)

## 2020-12-08 NOTE — Telephone Encounter (Signed)
MRI brain w/wo contrast: sent to GI for scheduling (they obtain auth)

## 2020-12-09 ENCOUNTER — Other Ambulatory Visit: Payer: Self-pay | Admitting: Adult Health

## 2020-12-09 LAB — CBC
Hematocrit: 41.8 % (ref 37.5–51.0)
Hemoglobin: 14.4 g/dL (ref 13.0–17.7)
MCH: 30.4 pg (ref 26.6–33.0)
MCHC: 34.4 g/dL (ref 31.5–35.7)
MCV: 88 fL (ref 79–97)
Platelets: 332 x10E3/uL (ref 150–450)
RBC: 4.74 x10E6/uL (ref 4.14–5.80)
RDW: 13.6 % (ref 11.6–15.4)
WBC: 7.5 x10E3/uL (ref 3.4–10.8)

## 2020-12-09 LAB — COMPREHENSIVE METABOLIC PANEL WITH GFR
ALT: 44 IU/L (ref 0–44)
AST: 26 IU/L (ref 0–40)
Albumin/Globulin Ratio: 1.6 (ref 1.2–2.2)
Albumin: 4.5 g/dL (ref 3.8–4.9)
Alkaline Phosphatase: 52 IU/L (ref 44–121)
BUN/Creatinine Ratio: 14 (ref 9–20)
BUN: 19 mg/dL (ref 6–24)
Bilirubin Total: 0.3 mg/dL (ref 0.0–1.2)
CO2: 24 mmol/L (ref 20–29)
Calcium: 9.7 mg/dL (ref 8.7–10.2)
Chloride: 101 mmol/L (ref 96–106)
Creatinine, Ser: 1.36 mg/dL — ABNORMAL HIGH (ref 0.76–1.27)
Globulin, Total: 2.9 g/dL (ref 1.5–4.5)
Glucose: 96 mg/dL (ref 65–99)
Potassium: 3.9 mmol/L (ref 3.5–5.2)
Sodium: 139 mmol/L (ref 134–144)
Total Protein: 7.4 g/dL (ref 6.0–8.5)
eGFR: 62 mL/min/1.73

## 2020-12-09 LAB — LIPID PANEL
Chol/HDL Ratio: 3.3 ratio (ref 0.0–5.0)
Cholesterol, Total: 148 mg/dL (ref 100–199)
HDL: 45 mg/dL
LDL Chol Calc (NIH): 85 mg/dL (ref 0–99)
Triglycerides: 97 mg/dL (ref 0–149)
VLDL Cholesterol Cal: 18 mg/dL (ref 5–40)

## 2020-12-09 LAB — VITAMIN B12: Vitamin B-12: 471 pg/mL (ref 232–1245)

## 2020-12-09 MED ORDER — EZETIMIBE 10 MG PO TABS
10.0000 mg | ORAL_TABLET | Freq: Every day | ORAL | 5 refills | Status: DC
Start: 2020-12-09 — End: 2021-06-10

## 2020-12-12 NOTE — Progress Notes (Signed)
I agree with the above plan 

## 2020-12-20 ENCOUNTER — Other Ambulatory Visit: Payer: Self-pay

## 2020-12-20 ENCOUNTER — Ambulatory Visit
Admission: RE | Admit: 2020-12-20 | Discharge: 2020-12-20 | Disposition: A | Discharge status: Home / Self Care | Payer: No Typology Code available for payment source | Source: Ambulatory Visit | Attending: Adult Health | Admitting: Adult Health

## 2020-12-20 DIAGNOSIS — R404 Transient alteration of awareness: Secondary | ICD-10-CM

## 2020-12-20 DIAGNOSIS — I639 Cerebral infarction, unspecified: Secondary | ICD-10-CM

## 2020-12-20 MED ORDER — GADOBENATE DIMEGLUMINE 529 MG/ML IV SOLN
20.0000 mL | Freq: Once | INTRAVENOUS | Status: AC | PRN
  Administered 2020-12-20: 20 mL via INTRAVENOUS

## 2020-12-22 ENCOUNTER — Telehealth: Payer: Self-pay

## 2020-12-22 NOTE — Telephone Encounter (Addendum)
Sent pt message via MyChart stating Hi Jordan Woods, I just wanted to let you know that recent imaging did not show any new or concerning findings compared to prior imaging in 2021.  EEG still needs to be completed which was ordered at prior visit and will be in contact to schedule if not already. If you have any questions please let us know. Have a great day!  Per Joni Reining, they had a back log of EEGs, so they have been trying to send some to the hospital and [facility].

## 2020-12-22 NOTE — Telephone Encounter (Signed)
-----   Message from Jordan Austin, NP sent at 12/22/2020  9:51 AM EDT ----- Please advise patient that recent imaging did not show any new or concerning findings compared to prior imaging in 2021.  EEG still needs to be completed which was ordered at prior visit -can you please assist with ensuring this is scheduled? Thank you!

## 2021-02-17 ENCOUNTER — Telehealth: Payer: Self-pay | Admitting: Adult Health

## 2021-02-17 ENCOUNTER — Ambulatory Visit: Payer: No Typology Code available for payment source | Admitting: Adult Health

## 2021-02-17 NOTE — Telephone Encounter (Signed)
Pt cancelled appt due to not able to come to appt. Transferred to Billing.

## 2021-04-10 IMAGING — MR MR HEAD W/O CM
7 of 11 series · 24 of 48 positions shown · non-contrast
Comparison: Head CTA 11/28/2019

CLINICAL DATA: Right upper extremity weakness and incoordination.
Speech disturbance.

EXAM:
MRI HEAD WITHOUT CONTRAST
TECHNIQUE: Multiplanar, multiecho pulse sequences of the brain and surrounding
structures were obtained without intravenous contrast.

[Series 2: DWI · axial · 3.0mm · 0.94mm/px · z∈[-55,+92]mm · 7 of 100 slices shown (1 of 2)]
[im 1/100]
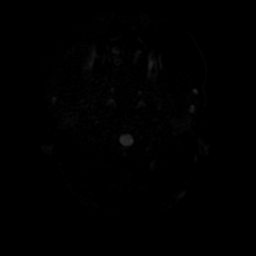
[im 17/100]
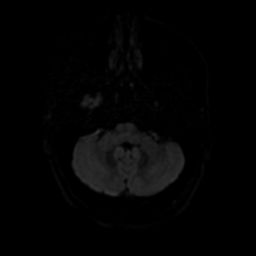
[im 34/100]
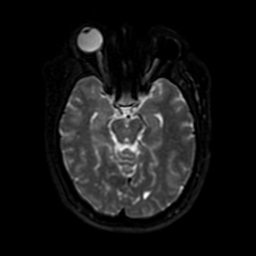
[im 50/100]
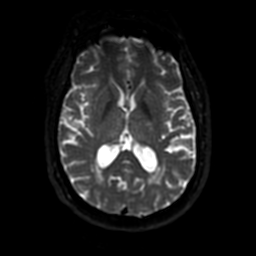
[im 67/100]
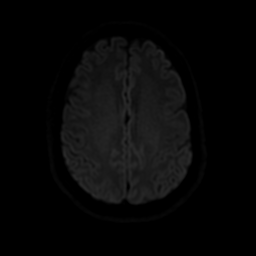
[im 83/100]
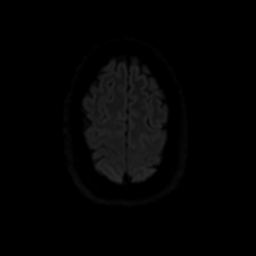
[im 100/100]
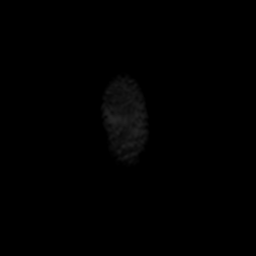

[Series 3: DWI · coronal · 4.0mm · 0.94mm/px · 5 of 76 slices shown (2 of 2)]
[im 1/76]
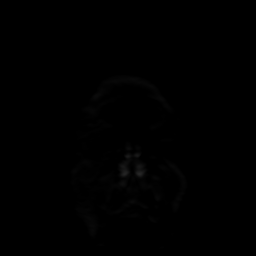
[im 19/76]
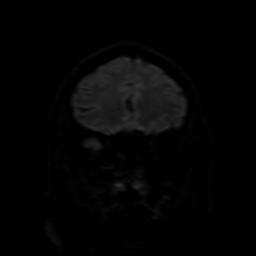
[im 38/76]
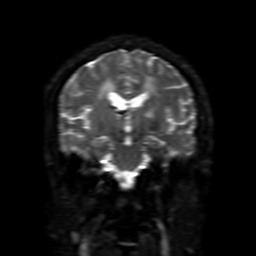
[im 57/76]
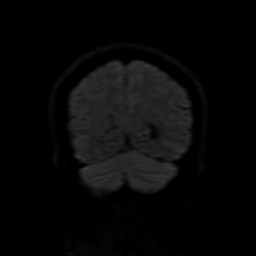
[im 76/76]
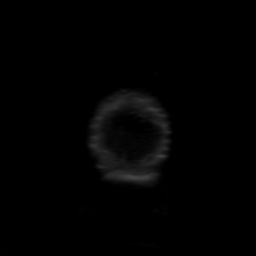

[Series 4: FLAIR · sagittal · 5.0mm · 0.23mm/px · 2 of 26 slices shown (1 of 2)]
[im 1/26]
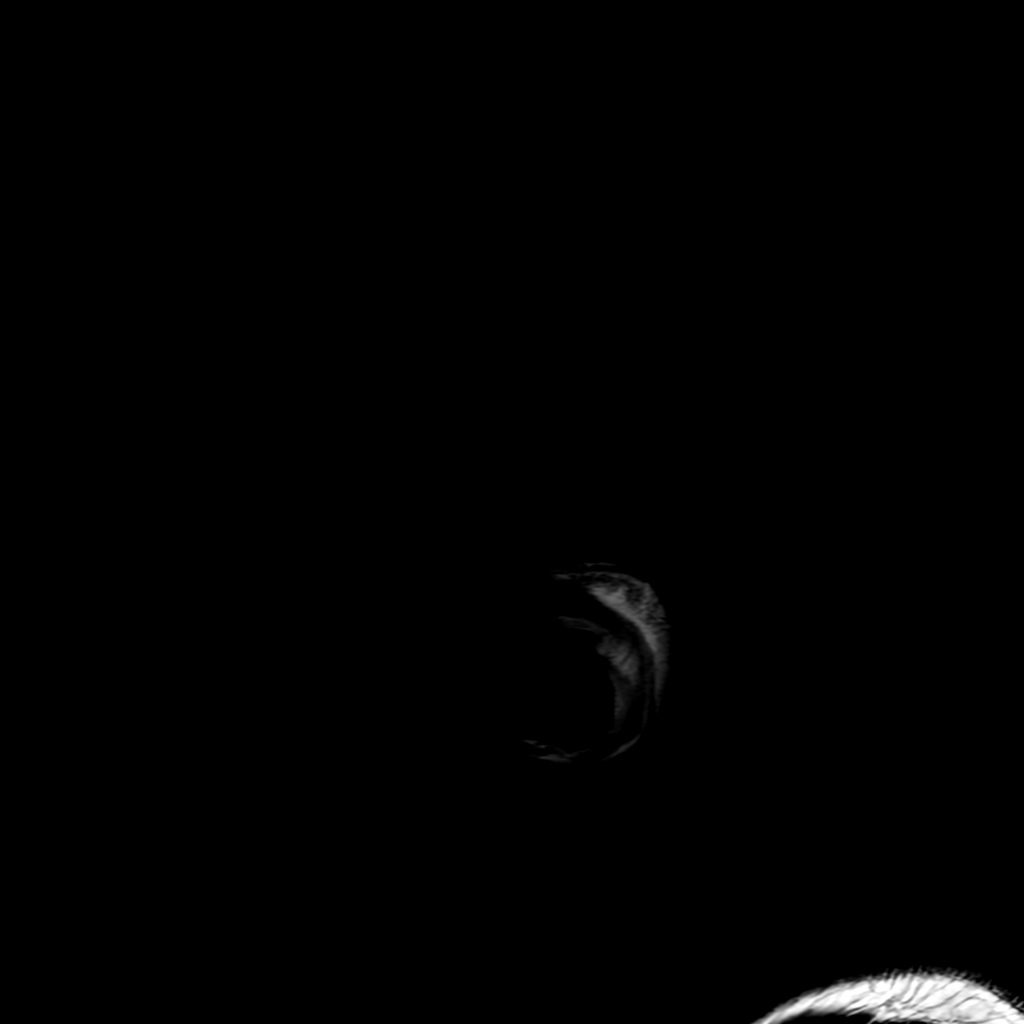
[im 26/26]
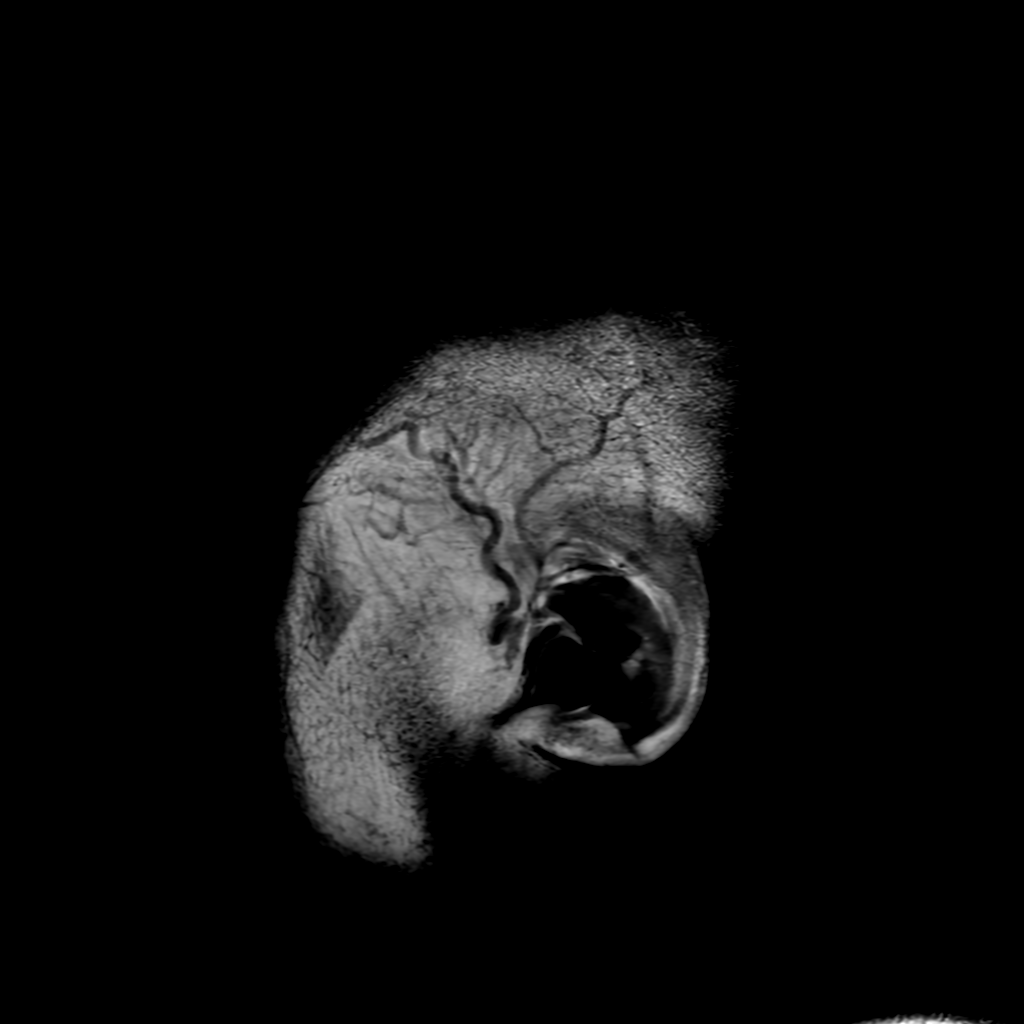

[Series 5: T2 · axial · 5.0mm · 0.23mm/px · 1 of 26 slices shown]
[im 1/26]
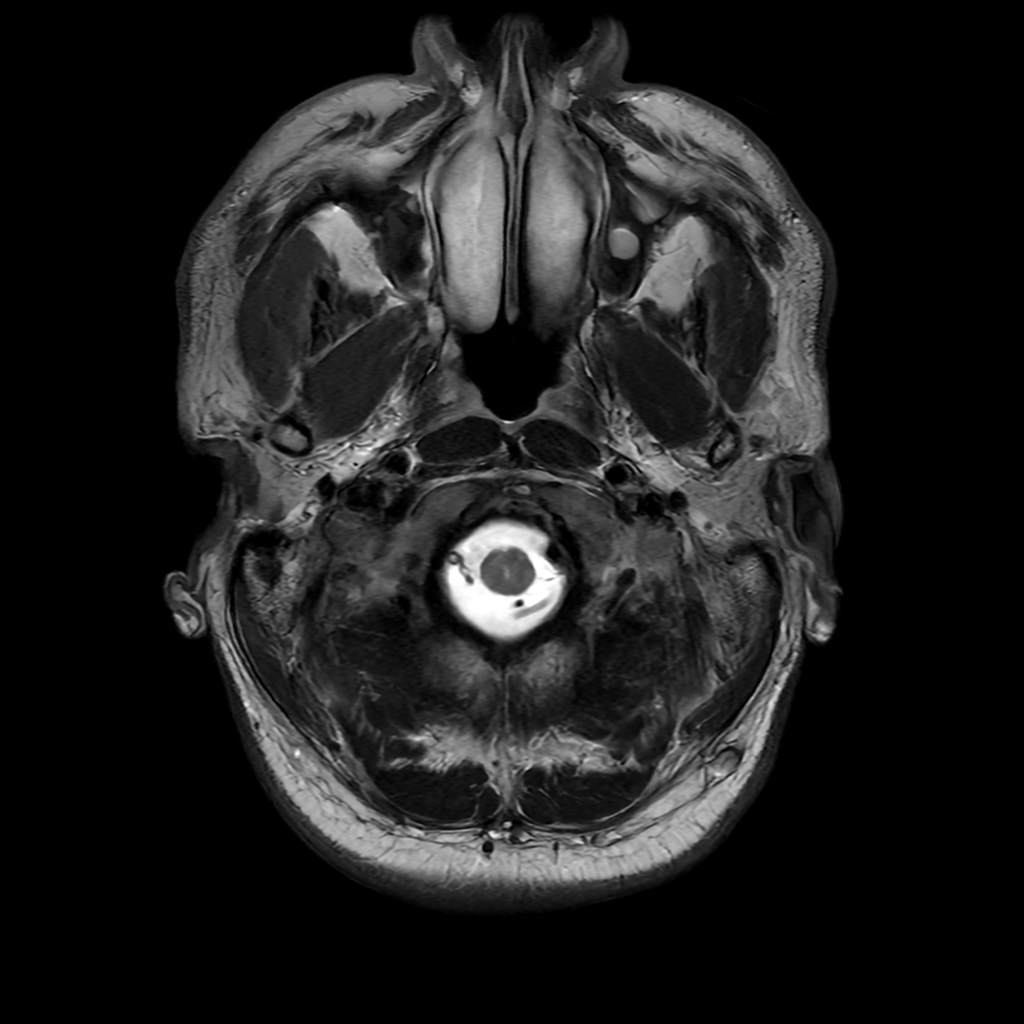

[Series 6: FLAIR · axial · 3.0mm · 0.45mm/px · z∈[-49,+101]mm · 2 of 26 slices shown (2 of 2)]
[im 1/26]
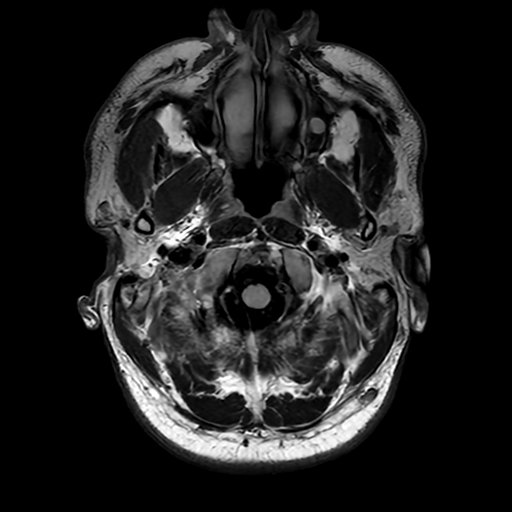
[im 26/26]
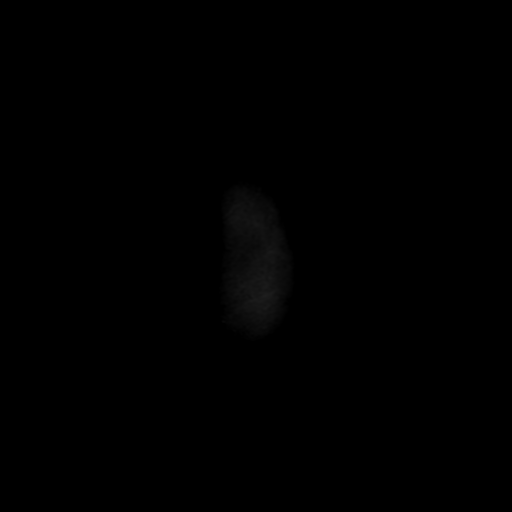

[Series 250: ADC · axial · 3.0mm · 0.94mm/px · z∈[-55,+92]mm · 4 of 49 slices shown (1 of 2)]
[im 1/49]
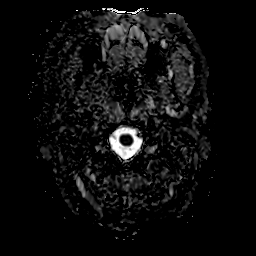
[im 17/49]
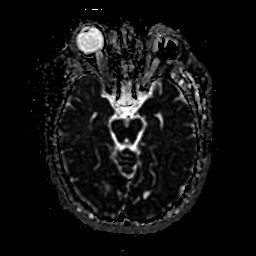
[im 33/49]
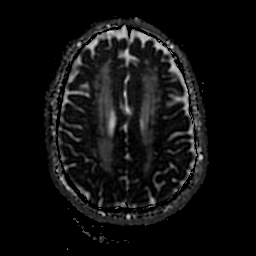
[im 49/49]
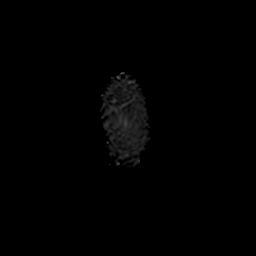

[Series 350: ADC · coronal · 4.0mm · 0.94mm/px · 3 of 38 slices shown (2 of 2)]
[im 1/38]
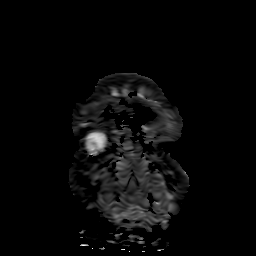
[im 19/38]
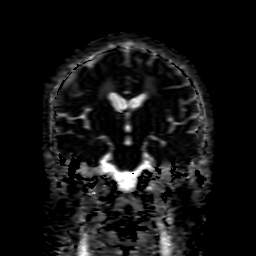
[im 38/38]
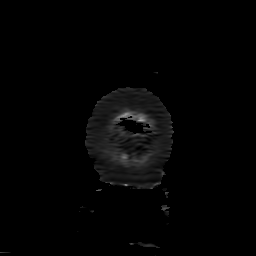

[24 of 48 positions shown; findings below may reference images not displayed]

FINDINGS: Brain: There is a 1 cm acute infarct in the posterior limb of the
left internal capsule. Patchy and confluent T2 hyperintensities in
the cerebral white matter bilaterally are nonspecific but compatible
with moderate to severe chronic small vessel ischemic disease,
greatly advanced for age. There is a chronic hemorrhage superiorly
in the right thalamus. There is mild cerebral atrophy.

Vascular: Abnormal appearance of the distal right vertebral artery,
shown to be hypoplastic but patent on the earlier CTA.

Skull and upper cervical spine: Unremarkable bone marrow signal.

Sinuses/Orbits: Left ocular prosthesis. Mild scattered mucosal
thickening and small mucous retention cysts in the paranasal
sinuses. Clear mastoid air cells.

Other: None.
IMPRESSION: 1. Acute infarct in the posterior limb of the left internal capsule.
2. Moderate to severe chronic small vessel ischemic disease.
3. Chronic right thalamic hemorrhage.

## 2021-04-10 IMAGING — CT CT ANGIO HEAD
2 of 11 series · 7 of 33 positions shown · IV contrast (Omnipaque)
Comparison: None.

CLINICAL DATA: Aphasia. Right arm numbness. Limited spatial
awareness.

EXAM:
CT ANGIOGRAPHY HEAD
TECHNIQUE: Multidetector CT imaging of the head was performed using the
standard protocol during bolus administration of intravenous
contrast. Multiplanar CT image reconstructions and MIPs were
obtained to evaluate the vascular anatomy.
CONTRAST:  80mL OMNIPAQUE IOHEXOL 350 MG/ML SOLN

[Series 9: cta head/neck · axial · 0.48mm/px · z∈[-96,-42]mm · 2 of 81 slices shown]
[im 27/81  soft-tissue]
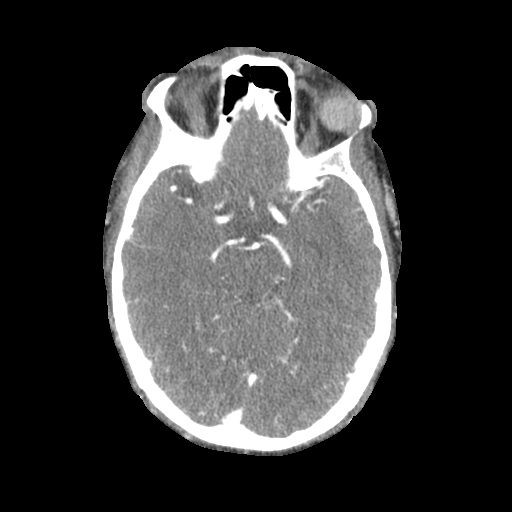
[im 54/81  soft-tissue]
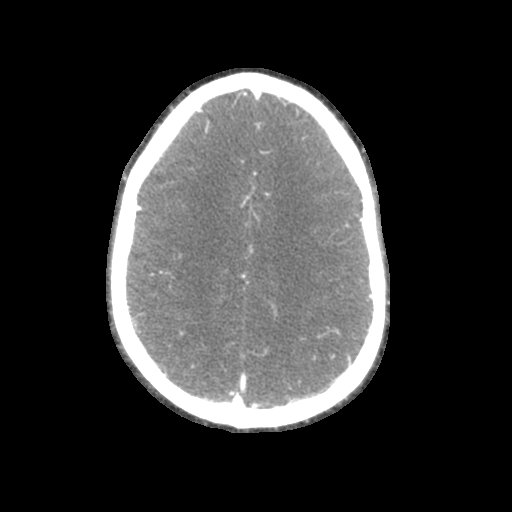

[Series 11: axial thin · axial · 0.41mm/px · z∈[-121,-14]mm · 5 of 161 slices shown]
[im 27/161  soft-tissue]
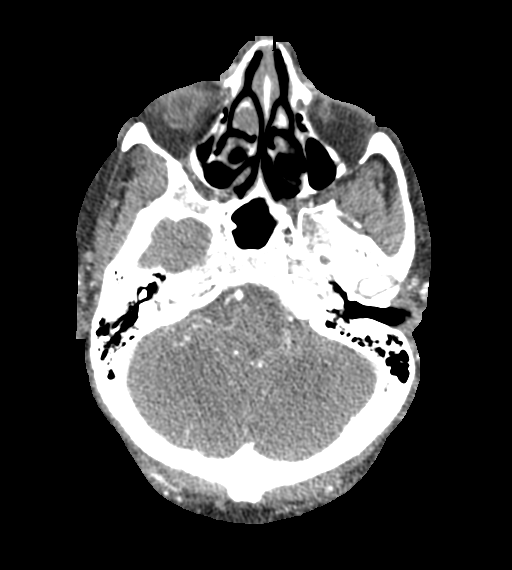
[im 54/161  bone]
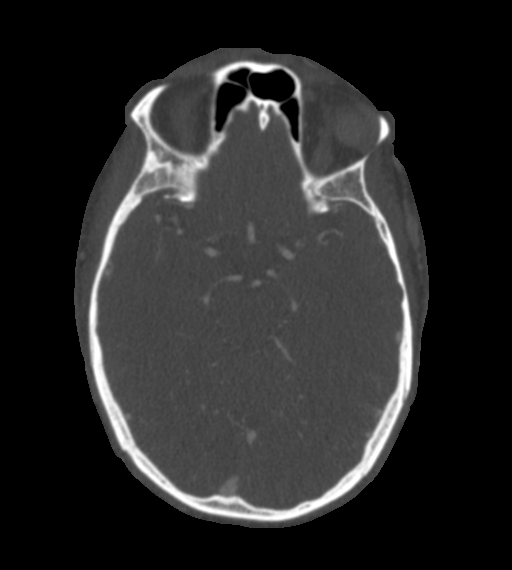
[im 81/161  soft-tissue]
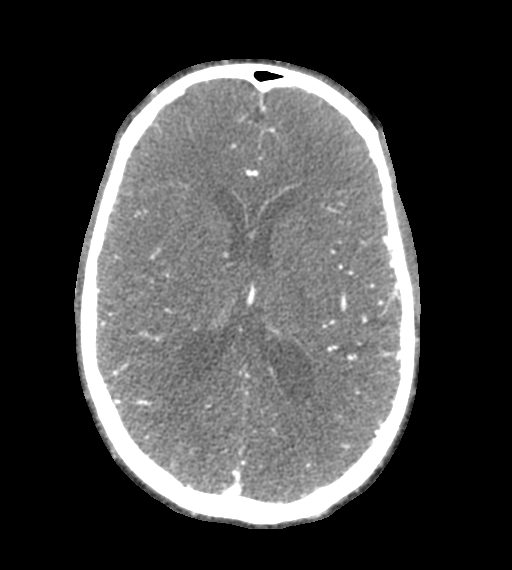
[im 107/161  bone]
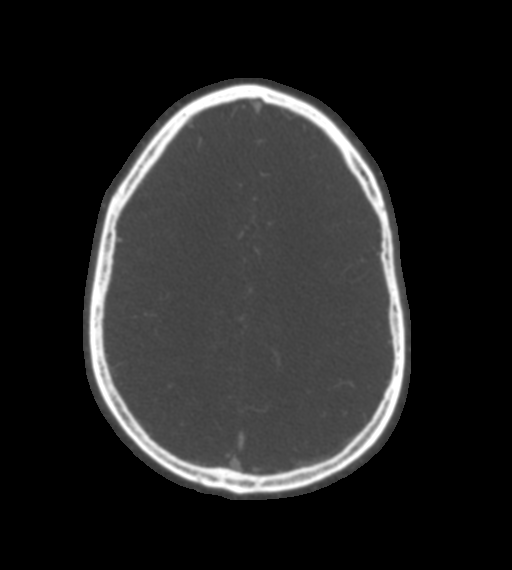
[im 134/161  soft-tissue]
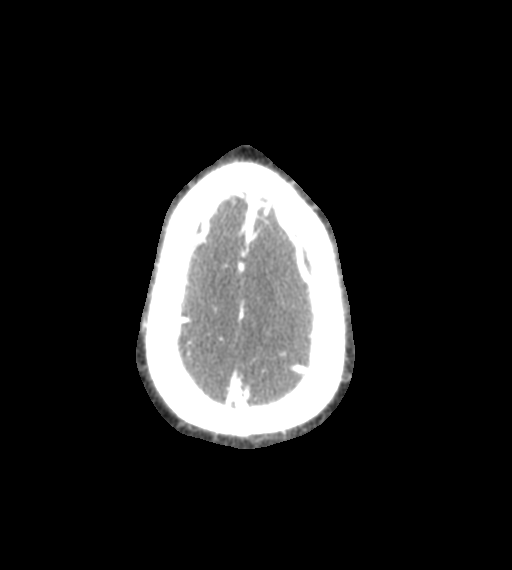

[7 of 33 positions shown; findings below may reference images not displayed]

FINDINGS: CT HEAD

Brain: Ventricle size normal. Cerebral volume slightly decreased for
age. Moderate to extensive white matter disease bilaterally. This is
symmetric and appears most consistent with chronic microvascular
ischemia.

Negative for acute infarct, hemorrhage, mass.

Vascular: Negative for hyperdense vessel

Skull: Negative

Sinuses: Mild mucosal edema left maxillary sinus. Remaining sinuses
clear.

Orbits: Left ocular prosthesis which is position laterally and
posteriorly. Right orbit normal.

CTA HEAD

Anterior circulation: Cavernous carotid widely patent without
atherosclerotic disease or stenosis. No aneurysm. Anterior and
middle cerebral arteries widely patent bilaterally without stenosis.

Anterior and middle cerebral arteries widely patent bilaterally
without stenosis or large vessel occlusion

Posterior circulation: Left vertebral artery dominant and widely
patent. Left PICA patent. Small right vertebral artery is patent.
Right PICA patent. Basilar widely patent. AICA, superior cerebellar,
posterior cerebral arteries patent bilaterally without stenosis or
aneurysm.

Venous sinuses: Normal venous enhancement.

Anatomic variants: None
IMPRESSION: 1. Extensive small vessel ischemic change in the white matter which
is most likely chronic. Given the patient's symptoms, recommend MRI
to evaluate for acute infarct. Correlate with risk factors for small
vessel disease. No intracranial hemorrhage.
2. Negative CTA head
3. These results were called by telephone at the time of
interpretation on 11/28/2019 at [DATE] to provider MENFRED INGA ,
who verbally acknowledged these results.

## 2021-06-10 ENCOUNTER — Other Ambulatory Visit: Payer: Self-pay | Admitting: Adult Health

## 2021-07-08 ENCOUNTER — Other Ambulatory Visit: Payer: Self-pay | Admitting: Adult Health
